# Patient Record
Sex: Female | Born: 1935 | Race: White | Hispanic: No | Marital: Married | Smoking: Never smoker
Health system: Southern US, Community
[De-identification: ages and names within clinical notes are randomized; demographics above are authoritative.]

## PROBLEM LIST (undated history)

## (undated) DIAGNOSIS — D499 Neoplasm of unspecified behavior of unspecified site: Secondary | ICD-10-CM

## (undated) DIAGNOSIS — C801 Malignant (primary) neoplasm, unspecified: Secondary | ICD-10-CM

## (undated) DIAGNOSIS — M1990 Unspecified osteoarthritis, unspecified site: Secondary | ICD-10-CM

## (undated) HISTORY — PX: ABDOMINAL HYSTERECTOMY: SHX81

## (undated) HISTORY — PX: BREAST ENHANCEMENT SURGERY: SHX7

---

## 1998-10-20 ENCOUNTER — Encounter: Payer: Self-pay | Admitting: Geriatric Medicine

## 1998-10-20 ENCOUNTER — Ambulatory Visit (HOSPITAL_COMMUNITY): Admission: RE | Admit: 1998-10-20 | Discharge: 1998-10-20 | Payer: Self-pay | Admitting: Geriatric Medicine

## 1999-12-29 ENCOUNTER — Ambulatory Visit (HOSPITAL_COMMUNITY): Admission: RE | Admit: 1999-12-29 | Discharge: 1999-12-29 | Payer: Self-pay | Admitting: Otolaryngology

## 1999-12-29 ENCOUNTER — Encounter: Payer: Self-pay | Admitting: Otolaryngology

## 2001-01-01 ENCOUNTER — Encounter: Payer: Self-pay | Admitting: Geriatric Medicine

## 2001-01-01 ENCOUNTER — Ambulatory Visit (HOSPITAL_COMMUNITY): Admission: RE | Admit: 2001-01-01 | Discharge: 2001-01-01 | Payer: Self-pay

## 2002-04-04 ENCOUNTER — Ambulatory Visit (HOSPITAL_COMMUNITY): Admission: RE | Admit: 2002-04-04 | Discharge: 2002-04-04 | Payer: Self-pay | Admitting: *Deleted

## 2003-09-02 ENCOUNTER — Ambulatory Visit (HOSPITAL_COMMUNITY): Admission: RE | Admit: 2003-09-02 | Discharge: 2003-09-02 | Payer: Self-pay | Admitting: Geriatric Medicine

## 2003-10-20 ENCOUNTER — Encounter: Admission: RE | Admit: 2003-10-20 | Discharge: 2003-10-20 | Payer: Self-pay | Admitting: Geriatric Medicine

## 2004-04-08 ENCOUNTER — Ambulatory Visit (HOSPITAL_COMMUNITY): Admission: RE | Admit: 2004-04-08 | Discharge: 2004-04-08 | Payer: Self-pay | Admitting: Gastroenterology

## 2004-04-08 ENCOUNTER — Encounter (INDEPENDENT_AMBULATORY_CARE_PROVIDER_SITE_OTHER): Payer: Self-pay | Admitting: Specialist

## 2005-09-04 ENCOUNTER — Ambulatory Visit (HOSPITAL_COMMUNITY): Admission: RE | Admit: 2005-09-04 | Discharge: 2005-09-04 | Payer: Self-pay | Admitting: Internal Medicine

## 2006-09-24 ENCOUNTER — Ambulatory Visit (HOSPITAL_COMMUNITY): Admission: RE | Admit: 2006-09-24 | Discharge: 2006-09-24 | Payer: Self-pay | Admitting: Internal Medicine

## 2007-09-13 ENCOUNTER — Ambulatory Visit (HOSPITAL_COMMUNITY): Admission: RE | Admit: 2007-09-13 | Discharge: 2007-09-13 | Payer: Self-pay | Admitting: Internal Medicine

## 2008-11-18 ENCOUNTER — Ambulatory Visit (HOSPITAL_COMMUNITY): Admission: RE | Admit: 2008-11-18 | Discharge: 2008-11-18 | Payer: Self-pay | Admitting: Internal Medicine

## 2009-12-28 ENCOUNTER — Ambulatory Visit (HOSPITAL_COMMUNITY): Admission: RE | Admit: 2009-12-28 | Discharge: 2009-12-28 | Payer: Self-pay | Admitting: Internal Medicine

## 2011-05-03 ENCOUNTER — Other Ambulatory Visit (HOSPITAL_COMMUNITY): Payer: Self-pay | Admitting: Internal Medicine

## 2011-05-03 DIAGNOSIS — Z1231 Encounter for screening mammogram for malignant neoplasm of breast: Secondary | ICD-10-CM

## 2011-05-16 ENCOUNTER — Ambulatory Visit (HOSPITAL_COMMUNITY): Payer: Medicare Other

## 2011-05-18 ENCOUNTER — Ambulatory Visit (HOSPITAL_COMMUNITY)
Admission: RE | Admit: 2011-05-18 | Discharge: 2011-05-18 | Disposition: A | Discharge status: Home / Self Care | Payer: Medicare Other | Source: Ambulatory Visit | Attending: Internal Medicine | Admitting: Internal Medicine

## 2011-05-18 DIAGNOSIS — Z1231 Encounter for screening mammogram for malignant neoplasm of breast: Secondary | ICD-10-CM | POA: Insufficient documentation

## 2012-10-01 ENCOUNTER — Other Ambulatory Visit (HOSPITAL_COMMUNITY): Payer: Self-pay | Admitting: Internal Medicine

## 2012-10-01 DIAGNOSIS — Z1231 Encounter for screening mammogram for malignant neoplasm of breast: Secondary | ICD-10-CM

## 2012-10-16 HISTORY — PX: OTHER SURGICAL HISTORY: SHX169

## 2012-10-22 ENCOUNTER — Ambulatory Visit (HOSPITAL_COMMUNITY): Payer: Medicare Other

## 2012-10-31 ENCOUNTER — Ambulatory Visit (HOSPITAL_COMMUNITY)
Admission: RE | Admit: 2012-10-31 | Discharge: 2012-10-31 | Disposition: A | Discharge status: Home / Self Care | Payer: Medicare Other | Source: Ambulatory Visit | Attending: Internal Medicine | Admitting: Internal Medicine

## 2012-10-31 DIAGNOSIS — Z1231 Encounter for screening mammogram for malignant neoplasm of breast: Secondary | ICD-10-CM | POA: Insufficient documentation

## 2013-04-23 ENCOUNTER — Other Ambulatory Visit: Payer: Self-pay | Admitting: Dermatology

## 2013-05-07 ENCOUNTER — Other Ambulatory Visit: Payer: Self-pay | Admitting: Dermatology

## 2013-12-08 ENCOUNTER — Other Ambulatory Visit: Payer: Self-pay | Admitting: Gastroenterology

## 2014-01-06 ENCOUNTER — Encounter (HOSPITAL_COMMUNITY): Payer: Self-pay | Admitting: Pharmacy Technician

## 2014-01-07 ENCOUNTER — Encounter (HOSPITAL_COMMUNITY): Payer: Self-pay | Admitting: *Deleted

## 2014-01-27 ENCOUNTER — Encounter (HOSPITAL_COMMUNITY)
Admission: RE | Disposition: A | Discharge status: Home / Self Care | Payer: Self-pay | Source: Ambulatory Visit | Attending: Gastroenterology

## 2014-01-27 ENCOUNTER — Encounter (HOSPITAL_COMMUNITY): Payer: Self-pay | Admitting: *Deleted

## 2014-01-27 ENCOUNTER — Ambulatory Visit (HOSPITAL_COMMUNITY): Payer: Medicare Other | Admitting: Registered Nurse

## 2014-01-27 ENCOUNTER — Encounter (HOSPITAL_COMMUNITY): Payer: Medicare Other | Admitting: Registered Nurse

## 2014-01-27 ENCOUNTER — Ambulatory Visit (HOSPITAL_COMMUNITY)
Admission: RE | Admit: 2014-01-27 | Discharge: 2014-01-27 | Disposition: A | Discharge status: Home / Self Care | Payer: Medicare Other | Source: Ambulatory Visit | Attending: Gastroenterology | Admitting: Gastroenterology

## 2014-01-27 DIAGNOSIS — Z1211 Encounter for screening for malignant neoplasm of colon: Secondary | ICD-10-CM | POA: Insufficient documentation

## 2014-01-27 DIAGNOSIS — I10 Essential (primary) hypertension: Secondary | ICD-10-CM | POA: Insufficient documentation

## 2014-01-27 DIAGNOSIS — K620 Anal polyp: Secondary | ICD-10-CM | POA: Insufficient documentation

## 2014-01-27 DIAGNOSIS — M810 Age-related osteoporosis without current pathological fracture: Secondary | ICD-10-CM | POA: Insufficient documentation

## 2014-01-27 DIAGNOSIS — Z85820 Personal history of malignant melanoma of skin: Secondary | ICD-10-CM | POA: Insufficient documentation

## 2014-01-27 DIAGNOSIS — K621 Rectal polyp: Secondary | ICD-10-CM

## 2014-01-27 DIAGNOSIS — K5730 Diverticulosis of large intestine without perforation or abscess without bleeding: Secondary | ICD-10-CM | POA: Insufficient documentation

## 2014-01-27 HISTORY — PX: COLONOSCOPY WITH PROPOFOL: SHX5780

## 2014-01-27 SURGERY — COLONOSCOPY WITH PROPOFOL
Anesthesia: Monitor Anesthesia Care

## 2014-01-27 MED ORDER — PROPOFOL 10 MG/ML IV BOLUS
INTRAVENOUS | Status: AC
  Filled 2014-01-27: qty 20

## 2014-01-27 MED ORDER — LACTATED RINGERS IV SOLN
INTRAVENOUS | Status: DC | PRN
  Administered 2014-01-27: 07:00:00 via INTRAVENOUS

## 2014-01-27 MED ORDER — LACTATED RINGERS IV SOLN
INTRAVENOUS | Status: DC
  Administered 2014-01-27: 1000 mL via INTRAVENOUS

## 2014-01-27 MED ORDER — SODIUM CHLORIDE 0.9 % IV SOLN: INTRAVENOUS | Status: DC

## 2014-01-27 MED ORDER — PROPOFOL INFUSION 10 MG/ML OPTIME
INTRAVENOUS | Status: DC | PRN
  Administered 2014-01-27: 300 ug/kg/min via INTRAVENOUS

## 2014-01-27 MED ORDER — PROMETHAZINE HCL 25 MG/ML IJ SOLN
6.2500 mg | INTRAMUSCULAR | Status: DC | PRN
Start: 2014-01-27 — End: 2014-01-27

## 2014-01-27 SURGICAL SUPPLY — 22 items

## 2014-01-27 NOTE — Progress Notes (Signed)
Prior to scope insertion time out was preformed at 0802.

## 2014-01-27 NOTE — Anesthesia Preprocedure Evaluation (Signed)
Anesthesia Evaluation  Patient identified by MRN, date of birth, ID band Patient awake    Reviewed: Allergy & Precautions, H&P , NPO status , Patient's Chart, lab work & pertinent test results  Airway Mallampati: II TM Distance: >3 FB Neck ROM: Full    Dental no notable dental hx.    Pulmonary neg pulmonary ROS,  breath sounds clear to auscultation  Pulmonary exam normal       Cardiovascular negative cardio ROS  Rhythm:Regular Rate:Normal     Neuro/Psych negative neurological ROS  negative psych ROS   GI/Hepatic negative GI ROS, Neg liver ROS,   Endo/Other  negative endocrine ROS  Renal/GU negative Renal ROS  negative genitourinary   Musculoskeletal negative musculoskeletal ROS (+)   Abdominal   Peds negative pediatric ROS (+)  Hematology negative hematology ROS (+)   Anesthesia Other Findings   Reproductive/Obstetrics negative OB ROS                           Anesthesia Physical Anesthesia Plan  ASA: I  Anesthesia Plan: MAC   Post-op Pain Management:    Induction: Intravenous  Airway Management Planned: Nasal Cannula  Additional Equipment:   Intra-op Plan:   Post-operative Plan:   Informed Consent: I have reviewed the patients History and Physical, chart, labs and discussed the procedure including the risks, benefits and alternatives for the proposed anesthesia with the patient or authorized representative who has indicated his/her understanding and acceptance.     Plan Discussed with: CRNA and Surgeon  Anesthesia Plan Comments:         Anesthesia Quick Evaluation  

## 2014-01-27 NOTE — Op Note (Signed)
Procedure: Screening colonoscopy  Endoscopist: Earle Gell  Premedication: Propofol administered by anesthesia  Procedure: The patient was placed in the left lateral decubitus position. Anal inspection and digital rectal exam were normal. The Pentax pediatric colonoscope was introduced into the rectum and advanced to the cecum. A normal-appearing appendiceal orifice and ileocecal valve were identified. Colonic preparation for the exam today was good.  Rectum. From the proximal rectum a 3 mm hyperplastic-appearing polyp was removed with the cold biopsy forceps. Retroflex view of the distal rectum was normal.  Sigmoid colon and descending colon. Left colonic diverticulosis  Splenic flexure. Normal  Transverse colon area normal  Hepatic flexure. Normal  Ascending colon. Normal  Cecum and ileocecal valve. Normal  Assessment:  #1. Left colonic diverticulosis  #2. From the proximal rectum a 3 mm hyperplastic-appearing sessile polyp was removed with the cold biopsy forceps   #3. Otherwise normal screening proctocolonoscopy to the cecum

## 2014-01-27 NOTE — Transfer of Care (Signed)
Immediate Anesthesia Transfer of Care Note  Patient: Colleen Hughes  Procedure(s) Performed: Procedure(s): COLONOSCOPY WITH PROPOFOL (N/A)  Patient Location: PACU  Anesthesia Type:General  Level of Consciousness: awake, alert , oriented and patient cooperative  Airway & Oxygen Therapy: Patient Spontanous Breathing and Patient connected to face mask oxygen  Post-op Assessment: Report given to PACU RN, Post -op Vital signs reviewed and stable and Patient moving all extremities X 4  Post vital signs: stable  Complications: No apparent anesthesia complications

## 2014-01-27 NOTE — Anesthesia Postprocedure Evaluation (Signed)
  Anesthesia Post-op Note  Patient: Colleen Hughes  Procedure(s) Performed: Procedure(s) (LRB): COLONOSCOPY WITH PROPOFOL (N/A)  Patient Location: PACU  Anesthesia Type: MAC  Level of Consciousness: awake and alert   Airway and Oxygen Therapy: Patient Spontanous Breathing  Post-op Pain: mild  Post-op Assessment: Post-op Vital signs reviewed, Patient's Cardiovascular Status Stable, Respiratory Function Stable, Patent Airway and No signs of Nausea or vomiting  Last Vitals:  Filed Vitals:   01/27/14 0845  BP: 104/56  Pulse:   Temp:   Resp: 18    Post-op Vital Signs: stable   Complications: No apparent anesthesia complications

## 2014-01-27 NOTE — H&P (Signed)
  Procedure: Screening colonoscopy. Normal baseline screening colonoscopy performed 04/08/2004.  History: The patient is a 78 year old female born 10/12/1936. She is scheduled to undergo a repeat screening colonoscopy with polypectomy to prevent colon cancer.  Past medical history: Melanoma surgery of the left neck. Total abdominal hysterectomy for fibroid tumors. Bilateral breast implants. Hypertension. Osteoporosis complicated by thoracic compression fracture. Right sciatica. Left wrist cyst. Left ankle fracture.  Medication allergies: Codeine causes nausea. Darvocet causes nausea.  Exam: The patient is alert and lying comfortably on the endoscopy stretcher. Abdomen is soft and nontender to palpation. Lungs are clear to auscultation. Cardiac exam reveals a regular rhythm.  Plan: Proceed with screening colonoscopy

## 2014-01-27 NOTE — Discharge Instructions (Signed)
Colonoscopy °Care After °These instructions give you information on caring for yourself after your procedure. Your doctor may also give you more specific instructions. Call your doctor if you have any problems or questions after your procedure. °HOME CARE °· Take it easy for the next 24 hours. °· Rest. °· Walk or use warm packs on your belly (abdomen) if you have belly cramping or gas. °· Do not drive for 24 hours. °· You may shower. °· Do not sign important papers or use machinery for 24 hours. °· Drink enough fluids to keep your pee (urine) clear or pale yellow. °· Resume your normal diet. Avoid heavy or fried foods. °· Avoid alcohol. °· Continue taking your normal medicines. °· Only take medicine as told by your doctor. Do not take aspirin. °If you had growths (polyps) removed: °· Do not take aspirin. °· Do not drink alcohol for 7 days or as told by your doctor. °· Eat a soft diet for 24 hours. °GET HELP RIGHT AWAY IF: °· You have a fever. °· You pass clumps of tissue (blood clots) or fill the toilet with blood. °· You have belly pain that gets worse and medicine does not help. °· Your belly is puffy (swollen). °· You feel sick to your stomach (nauseous) or throw up (vomit). °MAKE SURE YOU: °· Understand these instructions. °· Will watch your condition. °· Will get help right away if you are not doing well or get worse. °Document Released: 11/04/2010 Document Revised: 12/25/2011 Document Reviewed: 06/09/2013 °[employer]® Patient Information ©2014 [employer]. °Monitored Anesthesia Care  °Monitored anesthesia care is an anesthesia service for a medical procedure. Anesthesia is the loss of the ability to feel pain. It is produced by medications called anesthetics. It may affect a small area of your body (local anesthesia), a large area of your body (regional anesthesia), or your entire body (general anesthesia). The need for monitored anesthesia care depends your procedure, your condition, and the potential need  for regional or general anesthesia. It is often provided during procedures where:  °· General anesthesia may be needed if there are complications. This is because you need special care when you are under general anesthesia.   °· You will be under local or regional anesthesia. This is so that you are able to have higher levels of anesthesia if needed.   °· You will receive calming medications (sedatives). This is especially the case if sedatives are given to put you in a semi-conscious state of relaxation (deep sedation). This is because the amount of sedative needed to produce this state can be hard to predict. Too much of a sedative can produce general anesthesia. °Monitored anesthesia care is performed by one or more caregivers who have special training in all types of anesthesia. You will need to meet with these caregivers before your procedure. During this meeting, they will ask you about your medical history. They will also give you instructions to follow. (For example, you will need to stop eating and drinking before your procedure. You may also need to stop or change medications you are taking.) During your procedure, your caregivers will stay with you. They will:  °· Watch your condition. This includes watching you blood pressure, breathing, and level of pain.   °· Diagnose and treat problems that occur.   °· Give medications if they are needed. These may include calming medications (sedatives) and anesthetics.   °· Make sure you are comfortable.   °Having monitored anesthesia care does not necessarily mean that you will be under anesthesia.   It does mean that your caregivers will be able to manage anesthesia if you need it or if it occurs. It also means that you will be able to have a different type of anesthesia than you are having if you need it. When your procedure is complete, your caregivers will continue to watch your condition. They will make sure any medications wear off before you are allowed to go  home.  °Document Released: 06/28/2005 Document Revised: 01/27/2013 Document Reviewed: 11/13/2012 °[employer]® Patient Information ©2014 [employer]. ° °

## 2014-01-28 ENCOUNTER — Encounter (HOSPITAL_COMMUNITY): Payer: Self-pay | Admitting: Gastroenterology

## 2015-02-18 ENCOUNTER — Other Ambulatory Visit (HOSPITAL_COMMUNITY): Payer: Self-pay | Admitting: Dermatology

## 2018-05-08 ENCOUNTER — Encounter (HOSPITAL_COMMUNITY): Payer: Self-pay

## 2018-05-08 ENCOUNTER — Other Ambulatory Visit: Payer: Self-pay | Admitting: Internal Medicine

## 2018-05-08 ENCOUNTER — Other Ambulatory Visit: Payer: Self-pay

## 2018-05-08 ENCOUNTER — Emergency Department (HOSPITAL_COMMUNITY)
Admission: EM | Admit: 2018-05-08 | Discharge: 2018-05-08 | Disposition: A | Discharge status: Home / Self Care | Payer: Medicare Other | Attending: Emergency Medicine | Admitting: Emergency Medicine

## 2018-05-08 DIAGNOSIS — R319 Hematuria, unspecified: Secondary | ICD-10-CM

## 2018-05-08 DIAGNOSIS — Z79899 Other long term (current) drug therapy: Secondary | ICD-10-CM | POA: Insufficient documentation

## 2018-05-08 DIAGNOSIS — N3001 Acute cystitis with hematuria: Secondary | ICD-10-CM | POA: Insufficient documentation

## 2018-05-08 DIAGNOSIS — Z7982 Long term (current) use of aspirin: Secondary | ICD-10-CM | POA: Diagnosis not present

## 2018-05-08 DIAGNOSIS — N390 Urinary tract infection, site not specified: Secondary | ICD-10-CM

## 2018-05-08 DIAGNOSIS — R300 Dysuria: Secondary | ICD-10-CM | POA: Diagnosis present

## 2018-05-08 LAB — CBC WITH DIFFERENTIAL/PLATELET
Abs Immature Granulocytes: 0.1 10*3/uL (ref 0.0–0.1)
BASOS ABS: 0.1 10*3/uL (ref 0.0–0.1)
Basophils Relative: 0 %
EOS PCT: 3 %
Eosinophils Absolute: 0.5 10*3/uL (ref 0.0–0.7)
HEMATOCRIT: 33.8 % — AB (ref 36.0–46.0)
HEMOGLOBIN: 10.1 g/dL — AB (ref 12.0–15.0)
Immature Granulocytes: 1 %
Lymphocytes Relative: 16 %
Lymphs Abs: 3 10*3/uL (ref 0.7–4.0)
MCH: 26.9 pg (ref 26.0–34.0)
MCHC: 29.9 g/dL — AB (ref 30.0–36.0)
MCV: 90.1 fL (ref 78.0–100.0)
Monocytes Absolute: 1.3 10*3/uL — ABNORMAL HIGH (ref 0.1–1.0)
Monocytes Relative: 7 %
Neutro Abs: 14.2 10*3/uL — ABNORMAL HIGH (ref 1.7–7.7)
Neutrophils Relative %: 73 %
Platelets: 381 10*3/uL (ref 150–400)
RBC: 3.75 MIL/uL — ABNORMAL LOW (ref 3.87–5.11)
RDW: 14.6 % (ref 11.5–15.5)
WBC: 19.2 10*3/uL — AB (ref 4.0–10.5)

## 2018-05-08 LAB — URINALYSIS, ROUTINE W REFLEX MICROSCOPIC
Bilirubin Urine: NEGATIVE
Glucose, UA: NEGATIVE mg/dL
KETONES UR: NEGATIVE mg/dL
Nitrite: NEGATIVE
PH: 6 (ref 5.0–8.0)
Protein, ur: 100 mg/dL — AB
RBC / HPF: >50 RBC/hpf — ABNORMAL HIGH (ref 0–5)
Specific Gravity, Urine: 1.015 (ref 1.005–1.030)

## 2018-05-08 LAB — COMPREHENSIVE METABOLIC PANEL
ALBUMIN: 3.3 g/dL — AB (ref 3.5–5.0)
ALT: 11 U/L (ref 0–44)
AST: 16 U/L (ref 15–41)
Alkaline Phosphatase: 52 U/L (ref 38–126)
Anion gap: 10 (ref 5–15)
BILIRUBIN TOTAL: 0.6 mg/dL (ref 0.3–1.2)
BUN: 25 mg/dL — ABNORMAL HIGH (ref 8–23)
CO2: 25 mmol/L (ref 22–32)
Calcium: 8.7 mg/dL — ABNORMAL LOW (ref 8.9–10.3)
Chloride: 104 mmol/L (ref 98–111)
Creatinine, Ser: 1.7 mg/dL — ABNORMAL HIGH (ref 0.44–1.00)
GFR calc Af Amer: 32 mL/min — ABNORMAL LOW (ref >60)
GFR calc non Af Amer: 27 mL/min — ABNORMAL LOW (ref >60)
Glucose, Bld: 96 mg/dL (ref 70–99)
POTASSIUM: 4.2 mmol/L (ref 3.5–5.1)
Sodium: 139 mmol/L (ref 135–145)
TOTAL PROTEIN: 6.6 g/dL (ref 6.5–8.1)

## 2018-05-08 LAB — I-STAT CG4 LACTIC ACID, ED: Lactic Acid, Venous: 0.81 mmol/L (ref 0.5–1.9)

## 2018-05-08 MED ORDER — SODIUM CHLORIDE 0.9 % IV SOLN
1.0000 g | Freq: Once | INTRAVENOUS | Status: AC
  Administered 2018-05-08: 1 g via INTRAVENOUS
  Filled 2018-05-08: qty 10

## 2018-05-08 MED ORDER — CEPHALEXIN 500 MG PO CAPS: 500.0000 mg | ORAL_CAPSULE | Freq: Four times a day (QID) | ORAL | 0 refills | Status: AC

## 2018-05-08 NOTE — ED Notes (Signed)
Pt given sandwich bag and water per Dr. Tomi Bamberger

## 2018-05-08 NOTE — ED Provider Notes (Signed)
[facility] Provider Note   CSN: [id number] Arrival date & time: 05/08/18  1914     History   Chief Complaint Chief Complaint  Patient presents with  . Urinary Tract Infection    HPI Colleen Hughes is a 82 y.o. female.  HPI   Patient is an 82 year old female with a history of abdominal hysterectomy, who presents to the emergency department today for evaluation of dysuria, hematuria, and urinary frequency that have been ongoing for the last month but have seemed to worsen over the last 2 weeks.  Patient states that she had tried to treat her symptoms at home with Azo for the last few weeks, however she noted yesterday that she was having more hematuria than she has previously and she passed several clots throughout the day.  She also notes that she has had chills and has taken her temperature at home and her temperature has not been above 99.30F.  She has had intermittent lower abdominal pain for the last several months.  She denies any currently.  Denies any nausea, vomiting, diarrhea or constipation.  Denies any chest pain or shortness of breath. No vaginal bleeding or bloody stools. She was seen by her PCP who did a UA and lab work.  She states that she was contacted by her PCP and informed that she had a high white blood cell count and she needed to be seen in the emergency department.  States that her PCP scheduled her for an outpatient abdominal ultrasound on 05/17/2018.  History reviewed. No pertinent past medical history.  There are no active problems to display for this patient.   Past Surgical History:  Procedure Laterality Date  . ABDOMINAL HYSTERECTOMY     partial  . BREAST ENHANCEMENT SURGERY Left mid 1980,s  . COLONOSCOPY WITH PROPOFOL N/A 01/27/2014   Procedure: COLONOSCOPY WITH PROPOFOL;  Surgeon: Garlan Fair, MD;  Location: [facility];  Service: Endoscopy;  Laterality: N/A;     OB History   None      Home Medications     Prior to Admission medications   Medication Sig Start Date End Date Taking? Authorizing Provider  alendronate (FOSAMAX) 70 MG tablet Take 70 mg by mouth once a week. Take with a full glass of water on an empty stomach.    [provider]  aspirin EC 81 MG tablet Take 81 mg by mouth daily.    [provider]  calcium citrate-vitamin D (CITRACAL+D) 315-200 MG-UNIT per tablet Take 2 tablets by mouth daily.    [provider]  cephALEXin (KEFLEX) 500 MG capsule Take 1 capsule (500 mg total) by mouth 4 (four) times daily for 7 days. 05/08/18 05/15/18  Barbra Miner S, PA-C  Cholecalciferol (VITAMIN D) 2000 UNITS tablet Take 2,000 Units by mouth every other day.    [provider]  hydrochlorothiazide (HYDRODIURIL) 25 MG tablet Take 25 mg by mouth every morning.    [provider]  Multiple Vitamin (MULTIVITAMIN WITH MINERALS) TABS tablet Take 1 tablet by mouth daily.    [provider]    Family History No family history on file.  Social History Social History   Tobacco Use  . Smoking status: Never Smoker  . Smokeless tobacco: Never Used  Substance Use Topics  . Alcohol use: Yes    Comment: rarely  . Drug use: No     Allergies   Patient has no known allergies.   Review of Systems Review of  Systems  Constitutional: Positive for chills. Negative for fever.  HENT: Negative for sore throat.   Eyes: Negative for visual disturbance.  Respiratory: Negative for shortness of breath.   Cardiovascular: Negative for chest pain.  Gastrointestinal: Negative for abdominal pain, blood in stool, constipation, diarrhea, nausea and vomiting.  Genitourinary: Positive for dysuria, frequency, hematuria, pelvic pain and urgency. Negative for vaginal discharge.  Musculoskeletal: Negative for back pain.  Skin: Negative for wound.  Neurological: Negative for headaches.   Physical Exam Updated Vital Signs BP (!) 79/55   Pulse 92   Temp 98.6 F  (37 C) (Oral)   Resp 20   SpO2 100%   Physical Exam  Constitutional: She appears well-developed and well-nourished. No distress.  Pt is well appearing. Nontoxic.   HENT:  Head: Normocephalic and atraumatic.  Mouth/Throat: Oropharynx is clear and moist.  Eyes: Conjunctivae are normal. No scleral icterus.  Neck: Neck supple.  Cardiovascular: Normal rate, regular rhythm, normal heart sounds and intact distal pulses.  No murmur heard. Pulmonary/Chest: Effort normal and breath sounds normal. No stridor. No respiratory distress. She has no wheezes.  Abdominal: Soft. Bowel sounds are normal. She exhibits no distension. There is no tenderness. There is no guarding.  No CVA tenderness.  No significant suprapubic tenderness.  Musculoskeletal: She exhibits no edema.  Extremities warm and well perfused  Neurological: She is alert.  Skin: Skin is warm and dry. Capillary refill takes less than 2 seconds.  Psychiatric: She has a normal mood and affect.  Nursing note and vitals reviewed.  ED Treatments / Results  Labs (all labs ordered are listed, but only abnormal results are displayed) Labs Reviewed  COMPREHENSIVE METABOLIC PANEL - Abnormal; Notable for the following components:      Result Value   BUN 25 (*)    Creatinine, Ser 1.70 (*)    Calcium 8.7 (*)    Albumin 3.3 (*)    GFR calc non Af Amer 27 (*)    GFR calc Af Amer 32 (*)    All other components within normal limits  CBC WITH DIFFERENTIAL/PLATELET - Abnormal; Notable for the following components:   WBC 19.2 (*)    RBC 3.75 (*)    Hemoglobin 10.1 (*)    HCT 33.8 (*)    MCHC 29.9 (*)    Neutro Abs 14.2 (*)    Monocytes Absolute 1.3 (*)    All other components within normal limits  URINALYSIS, ROUTINE W REFLEX MICROSCOPIC - Abnormal; Notable for the following components:   APPearance CLOUDY (*)    Hgb urine dipstick LARGE (*)    Protein, ur 100 (*)    Leukocytes, UA LARGE (*)    RBC / HPF >50 (*)    WBC, UA >50 (*)     Bacteria, UA RARE (*)    Non Squamous Epithelial 0-5 (*)    All other components within normal limits  URINE CULTURE  I-STAT CG4 LACTIC ACID, ED  I-STAT CG4 LACTIC ACID, ED    EKG None  Radiology No results found.  Procedures Procedures (including critical care time)  Medications Ordered in ED Medications  cefTRIAXone (ROCEPHIN) 1 g in sodium chloride 0.9 % 100 mL IVPB (has no administration in time range)     Initial Impression / Assessment and Plan / ED Course  I have reviewed the triage vital signs and the nursing notes.  Pertinent labs & imaging results that were available during my care of the patient were reviewed by me and  considered in my medical decision making (see chart for details).  Discussed pt presentation and exam findings with Dr. Tomi Bamberger, who evaluated the patient and agrees with the plan to discharge the patient on p.o. antibiotics.  Advised to give a dose of antibiotics in the ED.  Bladder scan completed and patient had 342 cc in the bladder after voiding.  Final Clinical Impressions(s) / ED Diagnoses   Final diagnoses:  Urinary tract infection with hematuria, site unspecified   Pt has been diagnosed with a UTI. Pt is afebrile, no CVA tenderness, normotensive, and denies N/V.  Patient does have a leukocytosis to 19.1.  Mild anemia.  Her creatinine is elevated 1.7 her BUN is 25.  Lactic acid was negative.  UA consistent with UTI.  Also has large hematuria, proteinuria, greater than 50 RBCs, greater than 50 WBCs.  No prior labs to compare but patient denies history of CKD.  Advised her to orally hydrate and have her labs rechecked by her primary doctor later this week.  She is very well-appearing and is nontoxic.  Do not suspect sepsis in this patient.  Pt to be dc home with antibiotics and instructions to follow up with PCP if symptoms persist.  We will also give her a referral to urologist to follow-up with with regard to her hematuria.  Advised patient to  return to the ER for any new or worsening symptoms.  The patient and her son at bedside voiced an understanding of the plan and reasons to return immediately to the ED.  All questions were answered.  ED Discharge Orders        Ordered    cephALEXin (KEFLEX) 500 MG capsule  4 times daily     05/08/18 2228       Rodney Booze, PA-C 05/08/18 2235    Dorie Rank, MD 05/09/18 [phone]

## 2018-05-08 NOTE — Discharge Instructions (Signed)
You were given a prescription for antibiotics. Please take the antibiotic prescription fully.   Please follow up with urology and make an appointment for re-evaluation.   Please follow up with your primary doctor next week for re-evaluation.  Please return to the emergency room for any new or worsening symptoms including any fevers, abdominal pain, inability to urinate.

## 2018-05-08 NOTE — ED Triage Notes (Signed)
Pt states that she went to see her PCP for UTI, dysuria and hematuria, fevers, and placed on antibiotic, told to come here due to high WBC

## 2018-05-10 LAB — URINE CULTURE

## 2018-05-17 ENCOUNTER — Other Ambulatory Visit: Payer: Medicare Other

## 2018-05-31 ENCOUNTER — Other Ambulatory Visit: Payer: Self-pay

## 2018-05-31 ENCOUNTER — Other Ambulatory Visit: Payer: Self-pay | Admitting: Urology

## 2018-05-31 ENCOUNTER — Encounter (HOSPITAL_COMMUNITY): Payer: Self-pay | Admitting: *Deleted

## 2018-05-31 NOTE — Progress Notes (Signed)
Left voice mail message with [facility] Cord permit for surgery says transureethrall resection of bladder tumor and case is schedule for transurethral resection of bladder tumor and right cystoscopy

## 2018-05-31 NOTE — Progress Notes (Signed)
Spoke with dr Johnette Abraham. Gunnar Bulla and reviewed patient medical history and patient takes hctz, ekg orders received from dr e fitzgerald for day of surgery and placed in epic.

## 2018-06-02 NOTE — H&P (Signed)
}   Office Visit Report 05/30/2018    Colleen Hughes         MRN: [id number]  PRIMARY CARE:  Colleen Ahmadi, MD  DOB: 1936-07-30, 82 year old Female  REFERRING:    SSN: [id number]  PROVIDER:  Baruch Gouty, M.D.    TREATING:  Colleen Hughes, M.D.    LOCATION:  [facility] [phone]    CC: I have bladder cancer that needs to be treated.  HPI: Colleen Hughes is a 82 year-old female established patient who is here for follow-up of bladder cancer treatment.  Patient referred by Dr. Pilar Jarvis. Patient presented with gross hematuria and clots. She underwent CT scan May 17, 2018 which showed a right bladder mass, 6 cm, right hydroureteronephrosis. It did not appear to be T3 disease. The bones and the lymph nodes appeared normal.   She returns and has hesitancy and weak stream. Prior PVR was elevated.     ALLERGIES: Codeine - Nausea Darvocet - Nausea    MEDICATIONS: Aspirin 81 mg tablet,chewable  Hydrochlorothiazide 25 mg tablet  Aspirin PO Daily  Calcium Citrate - Vitamin D  CITRACAL + D PO Daily  HYDROCHLOROTHIAZIDE Daily  MULTIPLE VITAMINS PO Daily  Multivitamin  Vitamin D2 2,000 unit tablet  VITAMIN D2 Daily     GU PSH: Hysterectomy Locm 300-399Mg /Ml Iodine,1Ml - 05/17/2018    NON-GU PSH: Colonoscopy Partial Hysterectomy        GU PMH: Acute Cystitis/UTI - 05/13/2018 Gross hematuria - 05/13/2018 Incomplete bladder emptying - 05/13/2018    NON-GU PMH: None    FAMILY HISTORY: Prostate Cancer - Father    SOCIAL HISTORY: Marital Status: Widowed Preferred Language: English; Ethnicity: Not Hispanic Or Latino; Race: White Current Smoking Status: Patient has never smoked.  <DIV'  Tobacco Use Assessment Completed:  Used Tobacco in last 30 days?   Has never drank.  Does not use drugs. Drinks 1 caffeinated drink per day. Has not had a blood transfusion. Patient's occupation Estate agent.     REVIEW OF SYSTEMS:     GU Review Female:  Patient  reports frequent urination, get up at night to urinate, trouble starting your stream, and have to strain to urinate. Patient denies hard to postpone urination, burning /pain with urination, leakage of urine, stream starts and stops, and being pregnant.    Gastrointestinal (Upper):  Patient denies nausea, vomiting, and indigestion/ heartburn.    Gastrointestinal (Lower):  Patient denies diarrhea and constipation.    Constitutional:  Patient denies fever, night sweats, weight loss, and fatigue.    Skin:  Patient denies skin rash/ lesion and itching.    Eyes:  Patient denies blurred vision and double vision.    Ears/ Nose/ Throat:  Patient denies sore throat and sinus problems.    Hematologic/Lymphatic:  Patient denies swollen glands and easy bruising.    Cardiovascular:  Patient denies leg swelling and chest pains.    Respiratory:  Patient denies cough and shortness of breath.    Endocrine:  Patient denies excessive thirst.    Musculoskeletal:  Patient denies back pain and joint pain.    Neurological:  Patient denies headaches and dizziness.    Psychologic:  Patient denies depression and anxiety.    VITAL SIGNS:       05/30/2018 08:21 AM     BP 146/72 mmHg     Pulse 102 /min     Temperature 97.1 F / 36.1 C     GU PHYSICAL  EXAMINATION:      Urethra: No tenderness, no mass, no scarring. No hypermobility. No leakage.     Bladder: Bladder mass present at right BN/anterior vaginal wall - large and hard mass. Normal to palpation, no tenderness, normal size.      Vagina: Moderate introital stenosis. No atrophy. No rectocele. No cystocele. No enterocele.      MULTI-SYSTEM PHYSICAL EXAMINATION:      Constitutional: Well-nourished. No physical deformities. Normally developed. Good grooming.     Neck: Neck symmetrical, not swollen. Normal tracheal position.     Respiratory: No labored breathing, no use of accessory muscles.      Cardiovascular: Normal temperature, normal extremity pulses, no swelling,  no varicosities.     Neurologic / Psychiatric: Oriented to time, oriented to place, oriented to person. No depression, no anxiety, no agitation.     Gastrointestinal: No mass, no tenderness, no rigidity, non obese abdomen.            PAST DATA REVIEWED:   Source Of History:  Patient  Records Review:  Previous Doctor Records  X-Ray Review: C.T. Abdomen/Pelvis: Reviewed Films. 2019    Notes:  dr Pilar Jarvis notes   PROCEDURES:    Flexible Cystoscopy - 52000  Risks, benefits, and some of the potential complications of the procedure were discussed at length with the patient including infection, bleeding, voiding discomfort, urinary retention, fever, chills, sepsis, and others. All questions were answered. Informed consent was obtained. Antibiotic prophylaxis was given. Sterile technique and intraurethral analgesia were used. Chaperone - Lauren - for exam and cystoscopy. She was cathed for a 300 ml residual.   Meatus:  Normal size. Normal location. Normal condition.  Urethra:  No hypermobility. No leakage.  Ureteral Orifices:  Normal left location effluxed clear urine. Right UO obliterated by mass.   Bladder:  A large right necrotic lateral wall tumor encroaching on bladder neck. No trabeculation. No stones.      The lower urinary tract was carefully examined. The procedure was well-tolerated and without complications. Antibiotic instructions were given. Instructions were given to call the office immediately for bloody urine, difficulty urinating, urinary retention, painful or frequent urination, fever, chills, nausea, vomiting or other illness. The patient stated that she understood these instructions and would comply with them.    Urinalysis w/Scope  Dipstick Dipstick Cont'd Micro  Color: Yellow Bilirubin: Neg mg/dL WBC/hpf: 40 - 60/hpf  Appearance: Cloudy Ketones: Neg mg/dL RBC/hpf: 10 - 20/hpf  Specific Gravity: 1.020 Blood: 3+ ery/uL Bacteria: Few (10-25/hpf)  pH: 6.0 Protein: 2+ mg/dL  Cystals: NS (Not Seen)  Glucose: Neg mg/dL Urobilinogen: 0.2 mg/dL Casts: NS (Not Seen)   Nitrites: Positive Trichomonas: Not Present   Leukocyte Esterase: 3+ leu/uL Mucous: Not Present    Epithelial Cells: 0 - 5/hpf    Yeast: NS (Not Seen)    Sperm: Not Present   Notes: Microscopic done on unconcentrated urine     ASSESSMENT:     ICD-10 Details  1 GU:  Incomplete bladder emptying - R39.14   2  Bladder tumor/neoplasm - D41.4   3  Hydronephrosis - N13.0    PLAN:   Orders  Labs Urine Culture  Schedule  Return Visit/Planned Activity: Next Available Appointment - Schedule Surgery  Document  Letter(s):  Created for Patient: Clinical Summary   Notes:  Bladder tumor-clinically this tumor is T2 possibly T3/T4 at right BN. I discussed with the patient and her son the nature risks benefits and alternatives to TURBT. We discussed in  the long run we will probably need to consider medical oncology for chemotherapy and then cystectomy.   Right hydronephrosis-we will monitor and try and get a stent in at the procedure.   Incomplete bladder emptying-offered the patient a Foley catheter and she declined.   cc: Dr. Laurann Montana    * Signed by Colleen Hughes, M.D. on 05/30/18 at 3:55 PM (EDT)*  * Signed by Colleen Hughes, M.D. on 05/30/18 at 3:56 PM (EDT)*   The information contained in this medical record document is considered private and confidential patient information. This information can only be used for the medical diagnosis and/or medical services that are being provided by the patient's selected caregivers. This information can only be distributed outside of the patient's care if the patient agrees and signs waivers of authorization for this information to be sent to an outside source or route.

## 2018-06-03 ENCOUNTER — Ambulatory Visit (HOSPITAL_COMMUNITY): Payer: Medicare Other | Admitting: Certified Registered Nurse Anesthetist

## 2018-06-03 ENCOUNTER — Encounter (HOSPITAL_COMMUNITY): Payer: Self-pay | Admitting: *Deleted

## 2018-06-03 ENCOUNTER — Ambulatory Visit (HOSPITAL_COMMUNITY)
Admission: RE | Admit: 2018-06-03 | Discharge: 2018-06-03 | Disposition: A | Discharge status: Home / Self Care | Payer: Medicare Other | Source: Ambulatory Visit | Attending: Urology | Admitting: Urology

## 2018-06-03 ENCOUNTER — Encounter (HOSPITAL_COMMUNITY)
Admission: RE | Disposition: A | Discharge status: Home / Self Care | Payer: Self-pay | Source: Ambulatory Visit | Attending: Urology

## 2018-06-03 DIAGNOSIS — D414 Neoplasm of uncertain behavior of bladder: Secondary | ICD-10-CM

## 2018-06-03 DIAGNOSIS — Z7982 Long term (current) use of aspirin: Secondary | ICD-10-CM | POA: Insufficient documentation

## 2018-06-03 DIAGNOSIS — D649 Anemia, unspecified: Secondary | ICD-10-CM | POA: Insufficient documentation

## 2018-06-03 DIAGNOSIS — Z79899 Other long term (current) drug therapy: Secondary | ICD-10-CM | POA: Insufficient documentation

## 2018-06-03 DIAGNOSIS — N1330 Unspecified hydronephrosis: Secondary | ICD-10-CM | POA: Insufficient documentation

## 2018-06-03 DIAGNOSIS — C678 Malignant neoplasm of overlapping sites of bladder: Secondary | ICD-10-CM | POA: Insufficient documentation

## 2018-06-03 DIAGNOSIS — R3914 Feeling of incomplete bladder emptying: Secondary | ICD-10-CM | POA: Insufficient documentation

## 2018-06-03 HISTORY — DX: Unspecified osteoarthritis, unspecified site: M19.90

## 2018-06-03 HISTORY — PX: TRANSURETHRAL RESECTION OF BLADDER TUMOR: SHX2575

## 2018-06-03 LAB — BASIC METABOLIC PANEL
ANION GAP: 13 (ref 5–15)
BUN: 35 mg/dL — ABNORMAL HIGH (ref 8–23)
CALCIUM: 8.8 mg/dL — AB (ref 8.9–10.3)
CO2: 22 mmol/L (ref 22–32)
Chloride: 105 mmol/L (ref 98–111)
Creatinine, Ser: 1.8 mg/dL — ABNORMAL HIGH (ref 0.44–1.00)
GFR calc non Af Amer: 25 mL/min — ABNORMAL LOW (ref >60)
GFR, EST AFRICAN AMERICAN: 29 mL/min — AB (ref >60)
GLUCOSE: 99 mg/dL (ref 70–99)
POTASSIUM: 4.3 mmol/L (ref 3.5–5.1)
Sodium: 140 mmol/L (ref 135–145)

## 2018-06-03 LAB — CBC
HEMATOCRIT: 33.4 % — AB (ref 36.0–46.0)
HEMOGLOBIN: 10.4 g/dL — AB (ref 12.0–15.0)
MCH: 27.2 pg (ref 26.0–34.0)
MCHC: 31.1 g/dL (ref 30.0–36.0)
MCV: 87.4 fL (ref 78.0–100.0)
Platelets: 460 10*3/uL — ABNORMAL HIGH (ref 150–400)
RBC: 3.82 MIL/uL — AB (ref 3.87–5.11)
RDW: 14.6 % (ref 11.5–15.5)
WBC: 19.6 10*3/uL — ABNORMAL HIGH (ref 4.0–10.5)

## 2018-06-03 SURGERY — TURBT (TRANSURETHRAL RESECTION OF BLADDER TUMOR)
Anesthesia: General | Site: Bladder

## 2018-06-03 MED ORDER — LIDOCAINE HCL (CARDIAC) PF 100 MG/5ML IV SOSY
PREFILLED_SYRINGE | INTRAVENOUS | Status: DC | PRN
  Administered 2018-06-03: 40 mg via INTRAVENOUS

## 2018-06-03 MED ORDER — DEXAMETHASONE SODIUM PHOSPHATE 10 MG/ML IJ SOLN
INTRAMUSCULAR | Status: DC | PRN
  Administered 2018-06-03: 5 mg via INTRAVENOUS

## 2018-06-03 MED ORDER — SODIUM CHLORIDE 0.9 % IR SOLN
Status: DC | PRN
  Administered 2018-06-03: 33000 mL

## 2018-06-03 MED ORDER — DEXAMETHASONE SODIUM PHOSPHATE 10 MG/ML IJ SOLN
INTRAMUSCULAR | Status: AC
  Filled 2018-06-03: qty 1

## 2018-06-03 MED ORDER — FENTANYL CITRATE (PF) 100 MCG/2ML IJ SOLN
INTRAMUSCULAR | Status: AC
  Filled 2018-06-03: qty 2

## 2018-06-03 MED ORDER — LIDOCAINE 2% (20 MG/ML) 5 ML SYRINGE
INTRAMUSCULAR | Status: AC
  Filled 2018-06-03: qty 5

## 2018-06-03 MED ORDER — FENTANYL CITRATE (PF) 100 MCG/2ML IJ SOLN
INTRAMUSCULAR | Status: DC | PRN
  Administered 2018-06-03 (×4): 25 ug via INTRAVENOUS

## 2018-06-03 MED ORDER — FENTANYL CITRATE (PF) 100 MCG/2ML IJ SOLN
25.0000 ug | INTRAMUSCULAR | Status: DC | PRN
  Administered 2018-06-03 (×2): 50 ug via INTRAVENOUS

## 2018-06-03 MED ORDER — MEPERIDINE HCL 50 MG/ML IJ SOLN: 6.2500 mg | INTRAMUSCULAR | Status: DC | PRN

## 2018-06-03 MED ORDER — PHENYLEPHRINE 40 MCG/ML (10ML) SYRINGE FOR IV PUSH (FOR BLOOD PRESSURE SUPPORT)
PREFILLED_SYRINGE | INTRAVENOUS | Status: DC | PRN
  Administered 2018-06-03 (×12): 80 ug via INTRAVENOUS

## 2018-06-03 MED ORDER — PHENYLEPHRINE 40 MCG/ML (10ML) SYRINGE FOR IV PUSH (FOR BLOOD PRESSURE SUPPORT)
PREFILLED_SYRINGE | INTRAVENOUS | Status: AC
  Filled 2018-06-03: qty 10

## 2018-06-03 MED ORDER — CEFAZOLIN SODIUM-DEXTROSE 2-4 GM/100ML-% IV SOLN
2.0000 g | Freq: Once | INTRAVENOUS | Status: AC
  Administered 2018-06-03: 2 g via INTRAVENOUS
  Filled 2018-06-03: qty 100

## 2018-06-03 MED ORDER — ONDANSETRON HCL 4 MG/2ML IJ SOLN: 4.0000 mg | Freq: Once | INTRAMUSCULAR | Status: DC | PRN

## 2018-06-03 MED ORDER — ONDANSETRON HCL 4 MG/2ML IJ SOLN
INTRAMUSCULAR | Status: AC
  Filled 2018-06-03: qty 2

## 2018-06-03 MED ORDER — ONDANSETRON HCL 4 MG/2ML IJ SOLN
INTRAMUSCULAR | Status: DC | PRN
  Administered 2018-06-03: 4 mg via INTRAVENOUS

## 2018-06-03 MED ORDER — PROPOFOL 10 MG/ML IV BOLUS
INTRAVENOUS | Status: AC
  Filled 2018-06-03: qty 20

## 2018-06-03 MED ORDER — 0.9 % SODIUM CHLORIDE (POUR BTL) OPTIME
TOPICAL | Status: DC | PRN
  Administered 2018-06-03: 1000 mL

## 2018-06-03 MED ORDER — LACTATED RINGERS IV SOLN
INTRAVENOUS | Status: DC
  Administered 2018-06-03 (×2): via INTRAVENOUS

## 2018-06-03 MED ORDER — STERILE WATER FOR IRRIGATION IR SOLN
Status: DC | PRN
  Administered 2018-06-03: 500 mL

## 2018-06-03 MED ORDER — PROPOFOL 10 MG/ML IV BOLUS
INTRAVENOUS | Status: DC | PRN
  Administered 2018-06-03: 20 mg via INTRAVENOUS
  Administered 2018-06-03: 70 mg via INTRAVENOUS

## 2018-06-03 SURGICAL SUPPLY — 20 items
BAG URINE DRAINAGE (UROLOGICAL SUPPLIES) ×3 IMPLANT
BAG URO CATCHER STRL LF (MISCELLANEOUS) ×4 IMPLANT
CATH FOLEY 2WAY SLVR  5CC 18FR (CATHETERS) ×2
CATH FOLEY 2WAY SLVR 5CC 18FR (CATHETERS) ×1 IMPLANT
CATH URET 5FR 28IN OPEN ENDED (CATHETERS) ×3 IMPLANT
CLOTH BEACON ORANGE TIMEOUT ST (SAFETY) ×4 IMPLANT
GLOVE BIOGEL M STRL SZ7.5 (GLOVE) ×4 IMPLANT
GLOVE BIOGEL PI IND STRL 9 (GLOVE) ×1 IMPLANT
GLOVE BIOGEL PI INDICATOR 9 (GLOVE) ×2
GLOVE ECLIPSE 8.5 STRL (GLOVE) ×3 IMPLANT
GOWN STRL REUS W/TWL LRG LVL3 (GOWN DISPOSABLE) ×4 IMPLANT
GOWN STRL REUS W/TWL XL LVL3 (GOWN DISPOSABLE) ×4 IMPLANT
GUIDEWIRE STR DUAL SENSOR (WIRE) ×4 IMPLANT
LOOP CUT BIPOLAR 24F LRG (ELECTROSURGICAL) ×3 IMPLANT
MANIFOLD NEPTUNE II (INSTRUMENTS) ×4 IMPLANT
PACK CYSTO (CUSTOM PROCEDURE TRAY) ×4 IMPLANT
SYRINGE IRR TOOMEY STRL 70CC (SYRINGE) ×3 IMPLANT
TUBING CONNECTING 10 (TUBING) ×3 IMPLANT
TUBING CONNECTING 10' (TUBING) ×1
TUBING UROLOGY SET (TUBING) ×4 IMPLANT

## 2018-06-03 NOTE — Op Note (Signed)
Preoperative diagnosis: Bladder tumor, right hydronephrosis Postoperative diagnosis: Bladder tumor, right hydronephrosis  Procedure: TURBT greater than 5 cm  Surgeon: Junious Silk  Anesthesia: General  Indication for procedure: 82 year old female with irritative voiding symptoms and gross hematuria.  CT scan revealed large right bladder mass with right hydronephrosis.  Findings:  On exam and CT, clinical T2/T3 right bladder tumor, palpable at right anterior vaginal wall as a hard indurated mass but not fixed, right hydro and right renal atrophy  On cystoscopy mostly necrotic, high-grade appearing bladder tumor starting from about 7:00 on the right trigone obliterating the right ureteral orifice going all the way up the right bladder wall, but also wrapping around the bladder neck to about the 1 o'clock position.  90% of the bladder tumor was necrotic with the edges more superiorly and posteriorly and more at the bladder neck pink with some bleeding.    After resecting the tumor back to level with the bladder wall, she should have much better bladder volume and less irritative symptoms.  Also, she was at risk for completely occluding the bladder outlet and should have a much better flow. She had good left efflux.  She may need a nephrostomy tube on the right.   We were never able to find the right ureteral orifice.  Description of procedure: After consent was obtained patient brought to the operating room.  After adequate anesthesia she was placed in lithotomy position and prepped and draped in the usual sterile fashion.  A timeout was performed to confirm the patient and procedure.  An exam under anesthesia was performed I then placed the cystoscope and inspected and located the left ureteral orifice and at the right trigone a large tumor began when all the way up the right bladder wall with friable tumor along the bladder neck.  I placed the resectoscope and cleaned off circumferentially the  bladder neck all the way around to about the 1 o'clock position on the left side.  The left inferior bladder neck was clear as well as the left trigone and left ureteral orifice.  I then went superiorly and resected closer to the bladder neck for a few centimeters and found the depth of the bladder wall and brought this in line.  I then went from the trigone up and met those 2 resections.  Much of the tumor remained in the middle and this was necrotic and resected I then went inferior and posterior and found the bladder wall and brought this level up until we were at the far superior edge of the tumor which was resected down to its base.  This finally smoothed off and resected the entire tumor surface but clinically this tumor is unresectable with the resectoscope and loop.  Even after obtaining to specimen jars of chips the tumor is still palpable through the vaginal wall.  Hemostasis was excellent at low pressure.  We investigated for the right ureteral orifice and I even resected further into the right trigone.  We probed with a sensor wire.  She had good left efflux.  She may need a nephrostomy tube.  The bladder was filled and an 50 [place] coud catheter was placed in left to gravity drainage.  Irrigation and drainage was clear.  She was awakened taken to recovery room in stable condition.  Complications: None  Blood loss: 25 mL  Specimens to pathology: #1 bladder tumor #2 bladder tumor base  Drains: 18 French catheter  Disposition: Patient stable to PACU

## 2018-06-03 NOTE — Anesthesia Procedure Notes (Signed)
Procedure Name: LMA Insertion Date/Time: 06/03/2018 4:14 PM Performed by: Raenette Rover, CRNA Pre-anesthesia Checklist: Patient identified, Emergency Drugs available, Patient being monitored and Suction available Patient Re-evaluated:Patient Re-evaluated prior to induction Oxygen Delivery Method: Circle system utilized Preoxygenation: Pre-oxygenation with 100% oxygen Induction Type: IV induction Ventilation: Mask ventilation without difficulty LMA: LMA inserted LMA Size: 4.0 Number of attempts: 1 Placement Confirmation: positive ETCO2,  CO2 detector and breath sounds checked- equal and bilateral Tube secured with: Tape Dental Injury: Teeth and Oropharynx as per pre-operative assessment

## 2018-06-03 NOTE — Anesthesia Postprocedure Evaluation (Signed)
Anesthesia Post Note  Patient: Colleen Hughes  Procedure(s) Performed: TRANSURETHRAL RESECTION OF BLADDER TUMOR (TURBT) (N/A Bladder)     Patient location during evaluation: PACU Anesthesia Type: General Level of consciousness: awake and alert and oriented Pain management: pain level controlled Vital Signs Assessment: post-procedure vital signs reviewed and stable Respiratory status: spontaneous breathing, nonlabored ventilation and respiratory function stable Cardiovascular status: blood pressure returned to baseline and stable Postop Assessment: no apparent nausea or vomiting Anesthetic complications: no    Last Vitals:  Vitals:   06/03/18 1830 06/03/18 1845  BP: (!) 132/92 117/78  Pulse: 89 81  Resp: 15 19  Temp:    SpO2: 99% 99%    Last Pain:  Vitals:   06/03/18 1845  TempSrc:   PainSc: 2                  Feven Alderfer A.

## 2018-06-03 NOTE — Transfer of Care (Signed)
Immediate Anesthesia Transfer of Care Note  Patient: Colleen Hughes  Procedure(s) Performed: TRANSURETHRAL RESECTION OF BLADDER TUMOR (TURBT) (N/A Bladder)  Patient Location: PACU  Anesthesia Type:General  Level of Consciousness: awake, alert  and patient cooperative  Airway & Oxygen Therapy: Patient Spontanous Breathing and Patient connected to face mask oxygen  Post-op Assessment: Report given to RN and Post -op Vital signs reviewed and stable  Post vital signs: Reviewed and stable  Last Vitals:  Vitals Value Taken Time  BP 127/67 06/03/2018  6:21 PM  Temp 36.8 C 06/03/2018  6:21 PM  Pulse 87 06/03/2018  6:25 PM  Resp 14 06/03/2018  6:25 PM  SpO2 99 % 06/03/2018  6:25 PM  Vitals shown include unvalidated device data.  Last Pain:  Vitals:   06/03/18 1412  TempSrc:   PainSc: 0-No pain         Complications: No apparent anesthesia complications

## 2018-06-03 NOTE — Interval H&P Note (Signed)
History and Physical Interval Note:  06/03/2018 3:57 PM  [name]  has presented today for surgery, with the diagnosis of BLADDER TUMOR  The various methods of treatment have been discussed with the patient and family. After consideration of risks, benefits and other options for treatment, the patient has consented to  Procedure(s) with comments: TRANSURETHRAL RESECTION OF BLADDER TUMOR (TURBT) (N/A) - ONLY NEEDS 90 MIN FOR ALL PROCEDURES CYSTOSCOPY WITH STENT REPLACEMENT (Right) as a surgical intervention .  Pt without fever but she has urgency and weak stream. She has hematuria and clots. The patient's history has been reviewed, patient examined, no change in status, stable for surgery.  I have reviewed the patient's chart and labs.  Questions were answered to the patient's and family's satisfaction.  Discussed with patient and her family concern for bladder cancer, need for staged procedure is likely and possible future treatments include further TUR, chemo, radiation and cystectomy. They are slow to come to terms with the situation.    Colleen Hughes

## 2018-06-03 NOTE — Discharge Instructions (Signed)
Indwelling Urinary Catheter Care, Adult Take good care of your catheter to keep it working and to prevent problems. How to wear your catheter Attach your catheter to your leg with tape (adhesive tape) or a leg strap. Make sure it is not too tight. If you use tape, remove any bits of tape that are already on the catheter. How to wear a drainage bag You should have:  A large overnight bag.  A small leg bag.  Overnight Bag You may wear the overnight bag at any time. Always keep the bag below the level of your bladder but off the floor. When you sleep, put a clean plastic bag in a wastebasket. Then hang the bag inside the wastebasket. Leg Bag Never wear the leg bag at night. Always wear the leg bag below your knee. Keep the leg bag secure with a leg strap or tape. How to care for your skin  Clean the skin around the catheter at least once every day.  Shower every day. Do not take baths.  Put creams, lotions, or ointments on your genital area only as told by your doctor.  Do not use powders, sprays, or lotions on your genital area. How to clean your catheter and your skin 1. Wash your hands with soap and water. 2. Wet a washcloth in warm water and gentle (mild) soap. 3. Use the washcloth to clean the skin where the catheter enters your body. Clean downward and wipe away from the catheter in small circles. Do not wipe toward the catheter. 4. Pat the area dry with a clean towel. Make sure to clean off all soap. How to care for your drainage bags Empty your drainage bag when it is ?- full or at least 2-3 times a day. Replace your drainage bag once a month or sooner if it starts to smell bad or look dirty. Do not clean your drainage bag unless told by your doctor. Emptying a drainage bag  Supplies Needed  Rubbing alcohol.  Gauze pad or cotton ball.  Tape or a leg strap.  Steps 1. Wash your hands with soap and water. 2. Separate (detach) the bag from your leg. 3. Hold the bag over  the toilet or a clean container. Keep the bag below your hips and bladder. This stops pee (urine) from going back into the tube. 4. Open the pour spout at the bottom of the bag. 5. Empty the pee into the toilet or container. Do not let the pour spout touch any surface. 6. Put rubbing alcohol on a gauze pad or cotton ball. 7. Use the gauze pad or cotton ball to clean the pour spout. 8. Close the pour spout. 9. Attach the bag to your leg with tape or a leg strap. 10. Wash your hands.  Changing a drainage bag Supplies Needed  Alcohol wipes.  A clean drainage bag.  Adhesive tape or a leg strap.  Steps 1. Wash your hands with soap and water. 2. Separate the dirty bag from your leg. 3. Pinch the rubber catheter with your fingers so that pee does not spill out. 4. Separate the catheter tube from the drainage tube where these tubes connect (at the connection valve). Do not let the tubes touch any surface. 5. Clean the end of the catheter tube with an alcohol wipe. Use a different alcohol wipe to clean the end of the drainage tube. 6. Connect the catheter tube to the drainage tube of the clean bag. 7. Attach the new bag to  the leg with adhesive tape or a leg strap. 8. Wash your hands.  How to prevent infection and other problems  Never pull on your catheter or try to remove it. Pulling can damage tissue in your body.  Always wash your hands before and after touching your catheter.  If a leg strap gets wet, replace it with a dry one.  Drink enough fluids to keep your pee clear or pale yellow, or as told by your doctor.  Do not let the drainage bag or tubing touch the floor.  Wear cotton underwear.  If you are female, wipe from front to back after you poop (have a bowel movement).  Check on the catheter often to make sure it works and the tubing is not twisted. Get help if:  Your pee is cloudy.  Your pee smells unusually bad.  Your pee is not draining into the bag.  Your  tube gets clogged.  Your catheter starts to leak.  Your bladder feels full. Get help right away if:  You have redness, swelling, or pain where the catheter enters your body.  You have fluid, pus, or a bad smell coming from the area where the catheter enters your body.  The area where the catheter enters your body feels warm.  You have a fever.  You have pain in your: ? Stomach (abdomen). ? Legs. ? Lower back. ? Bladder.  You see blood fill the catheter.  Your pee is pink or red.  You feel sick to your stomach (nauseous).  You throw up (vomit).  You have chills.  Your catheter gets pulled out. This information is not intended to replace advice given to you by your health care provider. Make sure you discuss any questions you have with your health care provider. Document Released: 01/27/2013 Document Revised: 08/30/2016 Document Reviewed: 03/17/2014 Elsevier Interactive Patient Education  2018 [employer]. Cystoscopy, Care After Refer to this sheet in the next few weeks. These instructions provide you with information about caring for yourself after your procedure. Your health care provider may also give you more specific instructions. Your treatment has been planned according to current medical practices, but problems sometimes occur. Call your health care provider if you have any problems or questions after your procedure. What can I expect after the procedure? After the procedure, it is common to have:  Mild pain when you urinate. Pain should stop within a few minutes after you urinate. This may last for up to 1 week.  A small amount of blood in your urine for several days.  Feeling like you need to urinate but producing only a small amount of urine.  Follow these instructions at home:  Medicines  Take over-the-counter and prescription medicines only as told by your health care provider.  If you were prescribed an antibiotic medicine, take it as told by your  health care provider. Do not stop taking the antibiotic even if you start to feel better. General instructions   Return to your normal activities as told by your health care provider. Ask your health care provider what activities are safe for you.  Do not drive for 24 hours if you received a sedative.  Watch for any blood in your urine. If the amount of blood in your urine increases, call your health care provider.  Follow instructions from your health care provider about eating or drinking restrictions.  If a tissue sample was removed for testing (biopsy) during your procedure, it is your responsibility to get  your test results. Ask your health care provider or the department performing the test when your results will be ready.  Drink enough fluid to keep your urine clear or pale yellow.  Keep all follow-up visits as told by your health care provider. This is important. Contact a health care provider if:  You have pain that gets worse or does not get better with medicine, especially pain when you urinate.  You have difficulty urinating. Get help right away if:  You have more blood in your urine.  You have blood clots in your urine.  You have abdominal pain.  You have a fever or chills.  You are unable to urinate. This information is not intended to replace advice given to you by your health care provider. Make sure you discuss any questions you have with your health care provider. Document Released: 04/21/2005 Document Revised: 03/09/2016 Document Reviewed: 08/19/2015 Elsevier Interactive Patient Education  [employer].

## 2018-06-03 NOTE — Anesthesia Preprocedure Evaluation (Addendum)
Anesthesia Evaluation  Patient identified by MRN, date of birth, ID band Patient awake    Reviewed: Allergy & Precautions, NPO status , Patient's Chart, lab work & pertinent test results  Airway Mallampati: II  TM Distance: >3 FB Neck ROM: Full    Dental  (+) Lower Dentures, Upper Dentures   Pulmonary neg pulmonary ROS,    Pulmonary exam normal breath sounds clear to auscultation       Cardiovascular negative cardio ROS Normal cardiovascular exam Rhythm:Regular Rate:Normal  EKG- NSR with PVC's, poor R wave progression in V leads   Neuro/Psych negative neurological ROS  negative psych ROS   GI/Hepatic negative GI ROS, Neg liver ROS,   Endo/Other  negative endocrine ROS  Renal/GU Renal InsufficiencyRenal disease   Bladder Ca with right hydroureteronephrosis    Musculoskeletal  (+) Arthritis , Hands   Abdominal   Peds  Hematology  (+) anemia ,   Anesthesia Other Findings   Reproductive/Obstetrics                           Anesthesia Physical Anesthesia Plan  ASA: II  Anesthesia Plan: General   Post-op Pain Management:    Induction: Intravenous  PONV Risk Score and Plan: 4 or greater and Ondansetron, Dexamethasone and Treatment may vary due to age or medical condition  Airway Management Planned: LMA  Additional Equipment:   Intra-op Plan:   Post-operative Plan: Extubation in OR  Informed Consent: I have reviewed the patients History and Physical, chart, labs and discussed the procedure including the risks, benefits and alternatives for the proposed anesthesia with the patient or authorized representative who has indicated his/her understanding and acceptance.   Dental advisory given  Plan Discussed with: CRNA and Surgeon  Anesthesia Plan Comments:         Anesthesia Quick Evaluation

## 2018-06-04 ENCOUNTER — Encounter (HOSPITAL_COMMUNITY): Payer: Self-pay | Admitting: Urology

## 2018-06-12 ENCOUNTER — Telehealth: Payer: Self-pay | Admitting: Oncology

## 2018-06-12 ENCOUNTER — Encounter: Payer: Self-pay | Admitting: Oncology

## 2018-06-12 NOTE — Telephone Encounter (Signed)
New referral received from Dr. Pilar Jarvis at [facility] for urothelial carcinoma. Pt has been scheduled to see Dr. Alen Blew on 9/10 at 11am. Pt aware to arriv 30 minutes early. Letter mailed.

## 2018-06-25 ENCOUNTER — Telehealth: Payer: Self-pay

## 2018-06-25 ENCOUNTER — Inpatient Hospital Stay: Payer: Medicare Other | Attending: Oncology | Admitting: Oncology

## 2018-06-25 VITALS — BP 136/87 | HR 101 | Temp 98.6°F | Resp 18 | Ht 68.0 in | Wt 105.3 lb

## 2018-06-25 DIAGNOSIS — C679 Malignant neoplasm of bladder, unspecified: Secondary | ICD-10-CM | POA: Diagnosis not present

## 2018-06-25 DIAGNOSIS — R944 Abnormal results of kidney function studies: Secondary | ICD-10-CM | POA: Diagnosis not present

## 2018-06-25 DIAGNOSIS — N1330 Unspecified hydronephrosis: Secondary | ICD-10-CM

## 2018-06-25 DIAGNOSIS — N289 Disorder of kidney and ureter, unspecified: Secondary | ICD-10-CM | POA: Diagnosis not present

## 2018-06-25 NOTE — Telephone Encounter (Signed)
Printed avs and calender of upcoming appointment/. Per 9/10 los 

## 2018-06-25 NOTE — Progress Notes (Signed)
Reason for the request: Bladder cancer     HPI: I was asked by Dr. Junious Silk to evaluate Colleen Hughes for recent diagnosis of bladder cancer.  She is a pleasant 82 year old woman any significant comorbid conditions.  She started developing symptoms of hematuria, frequency and urgency for the last 3 months.  She subsequently was seen in the emergency department on 05/08/2017 for dysuria and hematuria and was diagnosed with urinary tract infection.  She was subsequently evaluated by Dr. Junious Silk and underwent scan on May 17, 2017 which showed a right bladder mass and right hydronephrosis indicating possible T3 bladder cancer.  She underwent cystoscopy and TURBT of a large tumor that is obliterating the ureteral orifice.  This was completed in June 03, 2018 with 90% of the tumor removed.  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  Based on these findings patient referred to me for evaluation for treatment options.  Clinically, she continues to have issues with urination.  She has issues with frequency and dysuria.  She also has fluctuations with urinary obstruction and voiding difficulties.  She reports overnight she had to go to the bathroom every 30 minutes.  She has lost some weight associated with this but remains active and continues to work full-time.  She denies any flank pain.   She does not report any headaches, blurry vision, syncope or seizures. Does not report any fevers, chills or sweats.  Does not report any cough, wheezing or hemoptysis.  Does not report any chest pain, palpitation, orthopnea or leg edema.  Does not report any nausea, vomiting or abdominal pain.  Does not report any constipation or diarrhea.  Does not report any skeletal complaints.    Does not report frequency, urgency or hematuria.  Does not report any skin rashes or lesions. Does not report any heat or cold intolerance.  Does not  report any lymphadenopathy or petechiae.  Does not report any anxiety or depression.  Remaining review of systems is negative.    Past Medical History:  Diagnosis Date  . Arthritis    hands  :  Past Surgical History:  Procedure Laterality Date  . ABDOMINAL HYSTERECTOMY  [id number]   partial  . basal cell skin cancer area removed  2014   left side of neck behind ear  . BREAST ENHANCEMENT SURGERY Left mid 1980,s  . COLONOSCOPY WITH PROPOFOL N/A 01/27/2014   Procedure: COLONOSCOPY WITH PROPOFOL;  Surgeon: Garlan Fair, MD;  Location: [facility];  Service: Endoscopy;  Laterality: N/A;  . TRANSURETHRAL RESECTION OF BLADDER TUMOR N/A 06/03/2018   Procedure: TRANSURETHRAL RESECTION OF BLADDER TUMOR (TURBT);  Surgeon: Festus Aloe, MD;  Location: [facility];  Service: Urology;  Laterality: N/A;  ONLY NEEDS 90 MIN FOR ALL PROCEDURES  :   Current Outpatient Medications:  .  acetaminophen (TYLENOL) 500 MG tablet, Take 500 mg by mouth every 6 (six) hours as needed., Disp: , Rfl:  .  aspirin EC 81 MG tablet, Take 81 mg by mouth daily., Disp: , Rfl:  .  calcium citrate-vitamin D (CITRACAL+D) 315-200 MG-UNIT per tablet, Take 2 tablets by mouth daily., Disp: , Rfl:  .  Cholecalciferol (VITAMIN D) 2000 UNITS tablet, Take 2,000 Units by mouth every other day., Disp: , Rfl:  .  hydrochlorothiazide (HYDRODIURIL) 25 MG tablet, Take 25 mg by mouth every morning., Disp: , Rfl:  .  Multiple Vitamin (MULTIVITAMIN WITH MINERALS) TABS tablet, Take 1 tablet by mouth  daily., Disp: , Rfl:  .  Naproxen Sodium (ALEVE) 220 MG CAPS, Take by mouth., Disp: , Rfl: :  Allergies  Allergen Reactions  . Codeine Nausea Only  :  No family history on file.:  Social History   Socioeconomic History  . Marital status: Married    Spouse name: Not on file  . Number of children: Not on file  . Years of education: Not on file  . Highest education level: Not on file  Occupational History  . Not on file  Social  Needs  . Financial resource strain: Not on file  . Food insecurity:    Worry: Not on file    Inability: Not on file  . Transportation needs:    Medical: Not on file    Non-medical: Not on file  Tobacco Use  . Smoking status: Never Smoker  . Smokeless tobacco: Never Used  Substance and Sexual Activity  . Alcohol use: Yes    Comment: rare beer  . Drug use: Never  . Sexual activity: Not on file  Lifestyle  . Physical activity:    Days per week: Not on file    Minutes per session: Not on file  . Stress: Not on file  Relationships  . Social connections:    Talks on phone: Not on file    Gets together: Not on file    Attends religious service: Not on file    Active member of club or organization: Not on file    Attends meetings of clubs or organizations: Not on file    Relationship status: Not on file  . Intimate partner violence:    Fear of current or ex partner: Not on file    Emotionally abused: Not on file    Physically abused: Not on file    Forced sexual activity: Not on file  Other Topics Concern  . Not on file  Social History Narrative  . Not on file  :  Pertinent items are noted in HPI.  Exam: Blood pressure 136/87, pulse (!) 101, temperature 98.6 F (37 C), temperature source Oral, resp. rate 18, height 5\' 8"  (1.727 m), weight 105 lb 4.8 oz (47.8 kg), SpO2 98 %.  ECOG 0 General appearance: alert and cooperative appeared without distress. Head: atraumatic without any abnormalities. Eyes: conjunctivae/corneas clear. PERRL.  Sclera anicteric. Throat: lips, mucosa, and tongue normal; without oral thrush or ulcers. Resp: clear to auscultation bilaterally without rhonchi, wheezes or dullness to percussion. Cardio: regular rate and rhythm, S1, S2 normal, no murmur, click, rub or gallop GI: soft, non-tender; bowel sounds normal; no masses,  no organomegaly Skin: Skin color, texture, turgor normal. No rashes or lesions Lymph nodes: Cervical, supraclavicular, and  axillary nodes normal. Neurologic: Grossly normal without any motor, sensory or deep tendon reflexes. Musculoskeletal: No joint deformity or effusion.  CBC    Component Value Date/Time   WBC 19.6 (H) 06/03/2018 1404   RBC 3.82 (L) 06/03/2018 1404   HGB 10.4 (L) 06/03/2018 1404   HCT 33.4 (L) 06/03/2018 1404   PLT 460 (H) 06/03/2018 1404   MCV 87.4 06/03/2018 1404   MCH 27.2 06/03/2018 1404   MCHC 31.1 06/03/2018 1404   RDW 14.6 06/03/2018 1404   LYMPHSABS 3.0 05/08/2018 2009   MONOABS 1.3 (H) 05/08/2018 2009   EOSABS 0.5 05/08/2018 2009   BASOSABS 0.1 05/08/2018 2009     Chemistry      Component Value Date/Time   NA 140 06/03/2018 1404   K 4.3  06/03/2018 1404   CL 105 06/03/2018 1404   CO2 22 06/03/2018 1404   BUN 35 (H) 06/03/2018 1404   CREATININE 1.80 (H) 06/03/2018 1404      Component Value Date/Time   CALCIUM 8.8 (L) 06/03/2018 1404   ALKPHOS 52 05/08/2018 2009   AST 16 05/08/2018 2009   ALT 11 05/08/2018 2009   BILITOT 0.6 05/08/2018 2009       Assessment and Plan:   82 year old woman with the following:  1.  High-grade poorly differentiated urothelial carcinoma involving the muscularis propria indicating at least T2 or T3N0 of the bladder diagnosed in August 2019.  Imaging studies showed organ confined tumor with right-sided hydronephrosis.  The natural course of this disease and treatment options were reviewed today with the patient and her Hughes.  Radical cystectomy still offers her the best curative option and despite her age, she remains in excellent performance status and would be a good candidate to have this operation.  The role for neoadjuvant chemotherapy utilizing cisplatin based chemotherapy was discussed.  Given her history, and creatinine clearance of less than 30 cc/min, she would not be a candidate for cisplatin based chemotherapy.  It is certainly possible that with a nephrostomy tube she could have some improvement in her renal function although  that is not guaranteed at this time.  I fear that she is very symptomatic at this time from her cancer and I think delaying definitive surgery would likely cause more decline in her quality of life and more symptoms related to the cancer.  Alternatively, radiation therapy concomitantly with chemotherapy can offer an inferior option in this case.  This option will not address her urinary symptoms which are troublesome for her.  I fear this is an inferior option for her as it will not address her symptoms and likely would not be curative.  After discussion today, my final recommendation is to proceed with radical cystectomy as soon as possible under the care of Dr. Junious Silk or his colleagues and I will evaluate her postoperatively for possible chemotherapy adjuvantly depending on her kidney function at that time.  2.  Renal insufficiency: Related to right-sided hydronephrosis.  Nephrostomy tube can potentially improve her renal drainage but I do not think this is the best approach.  I favor proceeding with radical cystectomy as soon as possible.  3.  Follow-up: We will be in 2 to 3 months to follow her progress.  Thank you for the referral.  A copy of this consult has been forwarded to the requesting physician.

## 2018-07-09 ENCOUNTER — Other Ambulatory Visit: Payer: Self-pay | Admitting: Urology

## 2018-07-20 ENCOUNTER — Emergency Department (HOSPITAL_COMMUNITY)
Admission: EM | Admit: 2018-07-20 | Discharge: 2018-07-20 | Disposition: A | Discharge status: Home / Self Care | Payer: Medicare Other | Source: Home / Self Care | Attending: Emergency Medicine | Admitting: Emergency Medicine

## 2018-07-20 ENCOUNTER — Other Ambulatory Visit: Payer: Self-pay

## 2018-07-20 ENCOUNTER — Encounter (HOSPITAL_COMMUNITY): Payer: Self-pay

## 2018-07-20 DIAGNOSIS — N3090 Cystitis, unspecified without hematuria: Secondary | ICD-10-CM

## 2018-07-20 DIAGNOSIS — C679 Malignant neoplasm of bladder, unspecified: Secondary | ICD-10-CM

## 2018-07-20 DIAGNOSIS — Z79899 Other long term (current) drug therapy: Secondary | ICD-10-CM | POA: Insufficient documentation

## 2018-07-20 DIAGNOSIS — Z7982 Long term (current) use of aspirin: Secondary | ICD-10-CM

## 2018-07-20 DIAGNOSIS — R339 Retention of urine, unspecified: Secondary | ICD-10-CM

## 2018-07-20 LAB — CBC WITH DIFFERENTIAL/PLATELET
Basophils Absolute: 0 10*3/uL (ref 0.0–0.1)
Basophils Relative: 0 %
EOS ABS: 0.3 10*3/uL (ref 0.0–0.7)
EOS PCT: 1 %
HCT: 35 % — ABNORMAL LOW (ref 36.0–46.0)
Hemoglobin: 11.1 g/dL — ABNORMAL LOW (ref 12.0–15.0)
LYMPHS ABS: 2.1 10*3/uL (ref 0.7–4.0)
LYMPHS PCT: 9 %
MCH: 27.1 pg (ref 26.0–34.0)
MCHC: 31.7 g/dL (ref 30.0–36.0)
MCV: 85.4 fL (ref 78.0–100.0)
MONO ABS: 1.2 10*3/uL — AB (ref 0.1–1.0)
MONOS PCT: 5 %
Neutro Abs: 19.2 10*3/uL — ABNORMAL HIGH (ref 1.7–7.7)
Neutrophils Relative %: 85 %
PLATELETS: 542 10*3/uL — AB (ref 150–400)
RBC: 4.1 MIL/uL (ref 3.87–5.11)
RDW: 15.3 % (ref 11.5–15.5)
WBC: 22.8 10*3/uL — AB (ref 4.0–10.5)

## 2018-07-20 LAB — I-STAT CHEM 8, ED
BUN: 38 mg/dL — ABNORMAL HIGH (ref 8–23)
CALCIUM ION: 1.14 mmol/L — AB (ref 1.15–1.40)
Chloride: 102 mmol/L (ref 98–111)
Creatinine, Ser: 1.4 mg/dL — ABNORMAL HIGH (ref 0.44–1.00)
GLUCOSE: 105 mg/dL — AB (ref 70–99)
HCT: 36 % (ref 36.0–46.0)
Hemoglobin: 12.2 g/dL (ref 12.0–15.0)
Potassium: 4.1 mmol/L (ref 3.5–5.1)
SODIUM: 136 mmol/L (ref 135–145)
TCO2: 23 mmol/L (ref 22–32)

## 2018-07-20 LAB — URINALYSIS, ROUTINE W REFLEX MICROSCOPIC
BILIRUBIN URINE: NEGATIVE
Glucose, UA: NEGATIVE mg/dL
KETONES UR: NEGATIVE mg/dL
NITRITE: NEGATIVE
PROTEIN: 100 mg/dL — AB
Specific Gravity, Urine: 1.012 (ref 1.005–1.030)
WBC, UA: >50 WBC/hpf — ABNORMAL HIGH (ref 0–5)
pH: 7 (ref 5.0–8.0)

## 2018-07-20 MED ORDER — FOSFOMYCIN TROMETHAMINE 3 G PO PACK
3.0000 g | PACK | Freq: Once | ORAL | Status: AC
  Administered 2018-07-20: 3 g via ORAL
  Filled 2018-07-20: qty 3

## 2018-07-20 NOTE — Discharge Instructions (Signed)
The urine has some markers for infection. To be on the safe side, given that you are having a surgery soon we will give you one round of medication that should take care of an infection.  We wish you the very best with the upcoming surgery.  Please return to the ER if your symptoms worsen; you have increased pain, fevers, chills, inability to keep any medications down, confusion. Otherwise see the outpatient doctor as requested.

## 2018-07-20 NOTE — ED Triage Notes (Signed)
Pt has hx of bladder cancer. Pt had indwelling foley cath placed approx 2.5 weeks.Pt states she emptied her bag at 0630. Since then, she has not had any output. Pt states that she noticed a "strange smell" to her urine. Pt states she also noticed objects floating in her urine prior to stoppage. Pt is scheduled for bladder surgery Wednesday 07/24/18

## 2018-07-20 NOTE — ED Provider Notes (Signed)
[place] DEPT Provider Note   CSN: [id number] Arrival date & time: 07/20/18  1443     History   Chief Complaint Chief Complaint  Patient presents with  . Dysuria    HPI Colleen Hughes is a 82 y.o. female.  HPI 82 year old female comes in with chief complaint of abdominal pain. Patient reports that she has not had any urinary output since last night, and now she is having suprapubic pain and distention. Patient has history of bladder cancer and has a Foley catheter in place for the past 2-1/2 weeks. Review of systems negative for fevers or chills.   Past Medical History:  Diagnosis Date  . Arthritis    hands    There are no active problems to display for this patient.   Past Surgical History:  Procedure Laterality Date  . ABDOMINAL HYSTERECTOMY  [id number]'C   partial  . basal cell skin cancer area removed  2014   left side of neck behind ear  . BREAST ENHANCEMENT SURGERY Left mid 1980,s  . COLONOSCOPY WITH PROPOFOL N/A 01/27/2014   Procedure: COLONOSCOPY WITH PROPOFOL;  Surgeon: Garlan Fair, MD;  Location: [facility];  Service: Endoscopy;  Laterality: N/A;  . TRANSURETHRAL RESECTION OF BLADDER TUMOR N/A 06/03/2018   Procedure: TRANSURETHRAL RESECTION OF BLADDER TUMOR (TURBT);  Surgeon: Festus Aloe, MD;  Location: [facility];  Service: Urology;  Laterality: N/A;  ONLY NEEDS 90 MIN FOR ALL PROCEDURES     OB History   None      Home Medications    Prior to Admission medications   Medication Sig Start Date End Date Taking? Authorizing Provider  acetaminophen (TYLENOL) 500 MG tablet Take 500-1,000 mg by mouth every 6 (six) hours as needed for moderate pain or headache.    Yes [provider]  aspirin EC 81 MG tablet Take 81 mg by mouth daily.   Yes [provider]  Calcium-Phosphorus-Vitamin D (CITRACAL +D3 PO) Take 2 tablets by mouth daily.   Yes [provider]  Cholecalciferol (VITAMIN D3) 2000  units TABS Take 2,000 Units by mouth daily.   Yes [provider]  Multiple Vitamin (MULTIVITAMIN WITH MINERALS) TABS tablet Take 1 tablet by mouth daily. [employer] Multivitamin   Yes [provider]  triamterene-hydrochlorothiazide (MAXZIDE-25) 37.5-25 MG tablet Take 1 tablet by mouth daily.   Yes [provider]    Family History No family history on file.  Social History Social History   Tobacco Use  . Smoking status: Never Smoker  . Smokeless tobacco: Never Used  Substance Use Topics  . Alcohol use: Yes    Comment: rare beer  . Drug use: Never     Allergies   Codeine   Review of Systems Review of Systems  All other systems reviewed and are negative.    Physical Exam Updated Vital Signs BP (!) 143/85 (BP Location: Left Arm)   Pulse (!) 118   Temp 99.3 F (37.4 C) (Oral)   Resp 16   Ht 5\' 6"  (1.676 m)   Wt 48.1 kg   SpO2 98%   BMI 17.11 kg/m   Physical Exam  Constitutional: She is oriented to person, place, and time. She appears well-developed.  HENT:  Head: Normocephalic and atraumatic.  Eyes: EOM are normal.  Neck: Normal range of motion. Neck supple.  Cardiovascular: Normal rate.  Pulmonary/Chest: Effort normal.  Abdominal: Bowel sounds are normal. She exhibits distension. There is tenderness.  Neurological: She is alert  and oriented to person, place, and time.  Skin: Skin is warm and dry.  Nursing note and vitals reviewed.    ED Treatments / Results  Labs (all labs ordered are listed, but only abnormal results are displayed) Labs Reviewed  CBC WITH DIFFERENTIAL/PLATELET - Abnormal; Notable for the following components:      Result Value   WBC 22.8 (*)    Hemoglobin 11.1 (*)    HCT 35.0 (*)    Platelets 542 (*)    Neutro Abs 19.2 (*)    Monocytes Absolute 1.2 (*)    All other components within normal limits  URINALYSIS, ROUTINE W REFLEX MICROSCOPIC - Abnormal; Notable for the following components:    APPearance CLOUDY (*)    Hgb urine dipstick MODERATE (*)    Protein, ur 100 (*)    Leukocytes, UA LARGE (*)    WBC, UA >50 (*)    Bacteria, UA MANY (*)    All other components within normal limits  I-STAT CHEM 8, ED - Abnormal; Notable for the following components:   BUN 38 (*)    Creatinine, Ser 1.40 (*)    Glucose, Bld 105 (*)    Calcium, Ion 1.14 (*)    All other components within normal limits    EKG None  Radiology No results found.  Procedures Procedures (including critical care time)  Medications Ordered in ED Medications  fosfomycin (MONUROL) packet 3 g (3 g Oral Given 07/20/18 1732)     Initial Impression / Assessment and Plan / ED Course  I have reviewed the triage vital signs and the nursing notes.  Pertinent labs & imaging results that were available during my care of the patient were reviewed by me and considered in my medical decision making (see chart for details).  Clinical Course as of Jul 21 1739  Sat Jul 20, 2018  1739 Patient's white count is 22.8.  Along with that her UA is showing many bacteria, many WBCs and large leukocytes.  Given that patient is supposed to get surgery soon, we will give her a dose of fosfomycin. Based on our evaluation.  Patient is not septic.   Patient's heart rate, which was 118 at arrival has improved to 88 post Foley catheter replacement.  WBC(!): 22.8 [AN]    Clinical Course User Index [AN] Varney Biles, MD    82 year old female comes in with chief complaint of abdominal distention.  She has history of bladder cancer and has chronic indwelling Foley catheter. It seems like her symptoms are mainly because of urinary retention.  We managed to exchange the Foley catheter.   Final Clinical Impressions(s) / ED Diagnoses   Final diagnoses:  Cystitis  Urinary retention    ED Discharge Orders    None       Varney Biles, MD 07/20/18 1740

## 2018-07-23 ENCOUNTER — Inpatient Hospital Stay (HOSPITAL_COMMUNITY)
Admission: AD | Admit: 2018-07-23 | Discharge: 2018-07-30 | DRG: 653 | Disposition: A | Discharge status: Home Health | Payer: Medicare Other | Attending: Urology | Admitting: Urology

## 2018-07-23 ENCOUNTER — Other Ambulatory Visit: Payer: Self-pay

## 2018-07-23 ENCOUNTER — Encounter (HOSPITAL_COMMUNITY): Payer: Self-pay

## 2018-07-23 DIAGNOSIS — M19041 Primary osteoarthritis, right hand: Secondary | ICD-10-CM | POA: Diagnosis present

## 2018-07-23 DIAGNOSIS — Q6101 Congenital single renal cyst: Secondary | ICD-10-CM

## 2018-07-23 DIAGNOSIS — D62 Acute posthemorrhagic anemia: Secondary | ICD-10-CM | POA: Diagnosis not present

## 2018-07-23 DIAGNOSIS — Z906 Acquired absence of other parts of urinary tract: Secondary | ICD-10-CM

## 2018-07-23 DIAGNOSIS — Z79899 Other long term (current) drug therapy: Secondary | ICD-10-CM

## 2018-07-23 DIAGNOSIS — Z960 Presence of urogenital implants: Secondary | ICD-10-CM | POA: Diagnosis present

## 2018-07-23 DIAGNOSIS — E43 Unspecified severe protein-calorie malnutrition: Secondary | ICD-10-CM | POA: Diagnosis present

## 2018-07-23 DIAGNOSIS — Z681 Body mass index (BMI) 19 or less, adult: Secondary | ICD-10-CM

## 2018-07-23 DIAGNOSIS — Z885 Allergy status to narcotic agent status: Secondary | ICD-10-CM

## 2018-07-23 DIAGNOSIS — M19042 Primary osteoarthritis, left hand: Secondary | ICD-10-CM | POA: Diagnosis present

## 2018-07-23 DIAGNOSIS — K567 Ileus, unspecified: Secondary | ICD-10-CM | POA: Diagnosis not present

## 2018-07-23 DIAGNOSIS — C7919 Secondary malignant neoplasm of other urinary organs: Secondary | ICD-10-CM | POA: Diagnosis present

## 2018-07-23 DIAGNOSIS — C679 Malignant neoplasm of bladder, unspecified: Principal | ICD-10-CM | POA: Diagnosis present

## 2018-07-23 DIAGNOSIS — Z7982 Long term (current) use of aspirin: Secondary | ICD-10-CM

## 2018-07-23 DIAGNOSIS — Z90711 Acquired absence of uterus with remaining cervical stump: Secondary | ICD-10-CM

## 2018-07-23 DIAGNOSIS — Z85828 Personal history of other malignant neoplasm of skin: Secondary | ICD-10-CM | POA: Diagnosis not present

## 2018-07-23 LAB — COMPREHENSIVE METABOLIC PANEL
ALT: 9 U/L (ref 0–44)
ANION GAP: 12 (ref 5–15)
AST: 13 U/L — ABNORMAL LOW (ref 15–41)
Albumin: 3.3 g/dL — ABNORMAL LOW (ref 3.5–5.0)
Alkaline Phosphatase: 42 U/L (ref 38–126)
BILIRUBIN TOTAL: 0.9 mg/dL (ref 0.3–1.2)
BUN: 42 mg/dL — ABNORMAL HIGH (ref 8–23)
CO2: 25 mmol/L (ref 22–32)
Calcium: 9.4 mg/dL (ref 8.9–10.3)
Chloride: 102 mmol/L (ref 98–111)
Creatinine, Ser: 1.49 mg/dL — ABNORMAL HIGH (ref 0.44–1.00)
GFR calc Af Amer: 37 mL/min — ABNORMAL LOW (ref >60)
GFR, EST NON AFRICAN AMERICAN: 32 mL/min — AB (ref >60)
Glucose, Bld: 95 mg/dL (ref 70–99)
POTASSIUM: 4.1 mmol/L (ref 3.5–5.1)
Sodium: 139 mmol/L (ref 135–145)
TOTAL PROTEIN: 6.5 g/dL (ref 6.5–8.1)

## 2018-07-23 LAB — CBC
HEMATOCRIT: 32.5 % — AB (ref 36.0–46.0)
Hemoglobin: 10 g/dL — ABNORMAL LOW (ref 12.0–15.0)
MCH: 26.5 pg (ref 26.0–34.0)
MCHC: 30.8 g/dL (ref 30.0–36.0)
MCV: 86 fL (ref 80.0–100.0)
PLATELETS: 477 10*3/uL — AB (ref 150–400)
RBC: 3.78 MIL/uL — ABNORMAL LOW (ref 3.87–5.11)
RDW: 14.9 % (ref 11.5–15.5)
WBC: 20.7 10*3/uL — AB (ref 4.0–10.5)
nRBC: 0 % (ref 0.0–0.2)

## 2018-07-23 LAB — PREPARE RBC (CROSSMATCH)

## 2018-07-23 LAB — ABO/RH: ABO/RH(D): O NEG

## 2018-07-23 MED ORDER — PIPERACILLIN-TAZOBACTAM 3.375 G IVPB 30 MIN
3.3750 g | INTRAVENOUS | Status: AC
  Administered 2018-07-24: 3.375 g via INTRAVENOUS
  Filled 2018-07-23 (×2): qty 50

## 2018-07-23 MED ORDER — METRONIDAZOLE 500 MG PO TABS
500.0000 mg | ORAL_TABLET | ORAL | Status: AC
  Administered 2018-07-23: 500 mg via ORAL
  Filled 2018-07-23: qty 1

## 2018-07-23 MED ORDER — SODIUM CHLORIDE 0.9 % IV SOLN
INTRAVENOUS | Status: DC
  Administered 2018-07-23: 13:00:00 via INTRAVENOUS

## 2018-07-23 MED ORDER — SODIUM CHLORIDE 0.9% IV SOLUTION: Freq: Once | INTRAVENOUS | Status: DC

## 2018-07-23 MED ORDER — NEOMYCIN SULFATE 500 MG PO TABS
500.0000 mg | ORAL_TABLET | ORAL | Status: AC
  Administered 2018-07-23: 500 mg via ORAL
  Filled 2018-07-23 (×2): qty 1

## 2018-07-23 MED ORDER — PEG 3350-KCL-NA BICARB-NACL 420 G PO SOLR
4000.0000 mL | Freq: Once | ORAL | Status: AC
  Administered 2018-07-23: 4000 mL via ORAL

## 2018-07-23 NOTE — Anesthesia Preprocedure Evaluation (Addendum)
Anesthesia Evaluation  Patient identified by MRN, date of birth, ID band Patient awake    Reviewed: Allergy & Precautions, NPO status , Patient's Chart, lab work & pertinent test results  Airway Mallampati: II  TM Distance: >3 FB Neck ROM: Full    Dental  (+) Dental Advisory Given, Lower Dentures, Upper Dentures   Pulmonary neg pulmonary ROS,    Pulmonary exam normal breath sounds clear to auscultation       Cardiovascular hypertension, Pt. on medications Normal cardiovascular exam Rhythm:Regular Rate:Normal     Neuro/Psych negative neurological ROS     GI/Hepatic negative GI ROS, Neg liver ROS,   Endo/Other  negative endocrine ROS  Renal/GU negative Renal ROS   Bladder cancer    Musculoskeletal negative musculoskeletal ROS (+) Arthritis ,   Abdominal   Peds  Hematology  (+) Blood dyscrasia, anemia ,   Anesthesia Other Findings Day of surgery medications reviewed with the patient.  Reproductive/Obstetrics                            Anesthesia Physical Anesthesia Plan  ASA: IV  Anesthesia Plan: General   Post-op Pain Management:    Induction: Intravenous  PONV Risk Score and Plan: 4 or greater and Dexamethasone, Ondansetron and Treatment may vary due to age or medical condition  Airway Management Planned: Oral ETT  Additional Equipment: Arterial line  Intra-op Plan:   Post-operative Plan: Possible Post-op intubation/ventilation  Informed Consent: I have reviewed the patients History and Physical, chart, labs and discussed the procedure including the risks, benefits and alternatives for the proposed anesthesia with the patient or authorized representative who has indicated his/her understanding and acceptance.   Dental advisory given  Plan Discussed with: CRNA  Anesthesia Plan Comments: (Arterial line, 2nd large bore PIV.)      Anesthesia Quick Evaluation

## 2018-07-23 NOTE — Consult Note (Signed)
[employer] Nurse requested for preoperative stoma site marking  Discussed surgical procedure and stoma creation with patient. She is very well educated on the surgical procedure.  Explained role of the [place] nurse team.  Provided the patient with educational booklet and provided samples of pouching options.  Answered patient questions about pouching.  Examined patient lying, sitting, and standing in order to place the marking in the patient's visual field, away from any creases or abdominal contour issues and within the rectus muscle.  Attempted to mark below the patient's belt line.   Marked for ileal conduit in the RLQ __2_  cm to the right of the umbilicus and  ___7_ cm below the umbilicus.   Patient's abdomen cleansed with CHG wipes at site markings, allowed to air dry prior to marking.Covered mark with thin film transparent dressing to preserve mark until date of surgery.   [place] Nurse team will follow up with patient after surgery for continue ostomy care and teaching.  [place] MSN, [place], [place], [place]

## 2018-07-23 NOTE — H&P (Signed)
Colleen Hughes is an 82 y.o. female.    Chief Complaint: Pre-Op Cystectomy.   HPI:   1 - Muscle Invasive Bladder Cancer - at least T2 (likely T3-T4) high grade bladder cancer with right malignant obstruction by TURBT 05/2018, found on eval gross hematuria and recurrent clot retention. CT with malignant right hydro and significnant soft tissue stranding nodularity arougn right trigone and suspect vaginal cuff area. No bulky adenopathy. Fat planes preserved. Had opinion from [facility] 06/2018 who agrees up-front surgery likely best option given symptomatic nature of disease and GFR compromise.   2 - Malignant Right Hydronephrosis - severe right hydro from bladder cancer by CT 05/2018. Ct 1.8s.   3 - Left Mildly Renal Cyst - about 2cm left upper cyst with some mild enhancement (around [facility]), no mass effect by CT 05/2018.   4 - Recurrent Clot / Urinary Retention - in and out of urinary retention since initial eval 05/2018 mostly due to clot retention. Now catheter dependant.   PMH sig for mammoplasty, benign hyst. 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 Her PCP is Colleen Orn MD.   Today "Colleen Hughes" is seen as pre-op admission for cystectomy tomorrow. No interval feers. Still having occaiosnal clot retention.     Past Medical History:  Diagnosis Date  . Arthritis    hands    Past Surgical History:  Procedure Laterality Date  . ABDOMINAL HYSTERECTOMY  [id number]   partial  . basal cell skin cancer area removed  2014   left side of neck behind ear  . BREAST ENHANCEMENT SURGERY Left mid 1980,s  . COLONOSCOPY WITH PROPOFOL N/A 01/27/2014   Procedure: COLONOSCOPY WITH PROPOFOL;  Surgeon: Garlan Fair, MD;  Location: [facility];  Service: Endoscopy;  Laterality: N/A;  . TRANSURETHRAL RESECTION OF BLADDER TUMOR N/A 06/03/2018   Procedure: TRANSURETHRAL RESECTION OF BLADDER TUMOR (TURBT);  Surgeon: Festus Aloe, MD;  Location: [facility] OR[facility];  Service: Urology;   Laterality: N/A;  ONLY NEEDS 90 MIN FOR ALL PROCEDURES    History reviewed. No pertinent family history. Social History:  reports that she has never smoked. She has never used smokeless tobacco. She reports that she drinks alcohol. She reports that she does not use drugs.  Allergies:  Allergies  Allergen Reactions  . Codeine Nausea Only    Medications Prior to Admission  Medication Sig Dispense Refill  . acetaminophen (TYLENOL) 500 MG tablet Take 500-1,000 mg by mouth every 6 (six) hours as needed for moderate pain or headache.     Marland Kitchen aspirin EC 81 MG tablet Take 81 mg by mouth daily.    Marland Kitchen triamterene-hydrochlorothiazide (MAXZIDE-25) 37.5-25 MG tablet Take 1 tablet by mouth daily.    . Calcium-Phosphorus-Vitamin D (CITRACAL +D3 PO) Take 2 tablets by mouth daily.    . Cholecalciferol (VITAMIN D3) 2000 units TABS Take 2,000 Units by mouth daily.    . Multiple Vitamin (MULTIVITAMIN WITH MINERALS) TABS tablet Take 1 tablet by mouth daily. [employer] Multivitamin      Results for orders placed or performed during the hospital encounter of 07/23/18 (from the past 48 hour(s))  CBC     Status: Abnormal   Collection Time: 07/23/18 11:51 AM  Result Value Ref Range   WBC 20.7 (H) 4.0 - 10.5 K/uL   RBC 3.78 (L) 3.87 - 5.11 MIL/uL   Hemoglobin 10.0 (L) 12.0 - 15.0 g/dL   HCT 32.5 (L) 36.0 - 46.0 %   MCV  86.0 80.0 - 100.0 fL   MCH 26.5 26.0 - 34.0 pg   MCHC 30.8 30.0 - 36.0 g/dL   RDW 14.9 11.5 - 15.5 %   Platelets 477 (H) 150 - 400 K/uL   nRBC 0.0 0.0 - 0.2 %    Comment: Performed at [facility], [place] [address] [place], [place] [address]  Comprehensive metabolic panel     Status: Abnormal   Collection Time: 07/23/18 11:51 AM  Result Value Ref Range   Sodium 139 135 - 145 mmol/L   Potassium 4.1 3.5 - 5.1 mmol/L   Chloride 102 98 - 111 mmol/L   CO2 25 22 - 32 mmol/L   Glucose, Bld 95 70 - 99 mg/dL   BUN 42 (H) 8 - 23 mg/dL   Creatinine, Ser 1.49 (H) 0.44 - 1.00  mg/dL   Calcium 9.4 8.9 - 10.3 mg/dL   Total Protein 6.5 6.5 - 8.1 g/dL   Albumin 3.3 (L) 3.5 - 5.0 g/dL   AST 13 (L) 15 - 41 U/L   ALT 9 0 - 44 U/L   Alkaline Phosphatase 42 38 - 126 U/L   Total Bilirubin 0.9 0.3 - 1.2 mg/dL   GFR calc non Af Amer 32 (L) >60 mL/min   GFR calc Af Amer 37 (L) >60 mL/min    Comment: (NOTE) The eGFR has been calculated using the CKD EPI equation. This calculation has not been validated in all clinical situations. eGFR's persistently <60 mL/min signify possible Chronic Kidney Disease.    Anion gap 12 5 - 15    Comment: Performed at [facility], [place] [address], [place], [place] [address]   No results found.  Review of Systems  Constitutional: Positive for weight loss. Negative for chills and fever.  HENT: Negative.   Eyes: Negative.   Respiratory: Negative.   Gastrointestinal: Negative.   Genitourinary: Positive for hematuria.  Musculoskeletal: Negative.   Skin: Negative.   Neurological: Negative.   Endo/Heme/Allergies: Negative.   Psychiatric/Behavioral: Negative.     Blood pressure 118/78, pulse 98, temperature 98.7 F (37.1 C), temperature source Oral, resp. rate 18, height '5\' 6"'  (1.676 m), weight 45 kg, SpO2 100 %. Physical Exam  Constitutional: She appears well-developed.  Very metnally astute. This as per baseline.   HENT:  Head: Normocephalic.  Neck: Normal range of motion.  Cardiovascular: Normal rate.  Respiratory: Effort normal.  GI: Soft.  RLQ stomal marking site noted.   Genitourinary:  Genitourinary Comments: Foley in place with clear urine that is non-foul at present. NO CVAT.   Musculoskeletal: Normal range of motion.  Neurological: She is alert.  Skin: Skin is warm.  Psychiatric: She has a normal mood and affect.     Assessment/Plan  Proceed as planned with cytectomy / conduit diversion tomorrow. Risks, benefits, alternatives, expected peri-op course discssed extensively previously and reiterated  today.   CMP, CBC, Bowel prep, 1/2 MIVF, Stomal Marking, Consent, T+C 2 units.   Alexis Frock, MD 07/23/2018, 6:43 PM

## 2018-07-24 ENCOUNTER — Encounter (HOSPITAL_COMMUNITY)
Admission: AD | Disposition: A | Discharge status: Home Health | Payer: Self-pay | Source: Home / Self Care | Attending: Urology

## 2018-07-24 ENCOUNTER — Encounter (HOSPITAL_COMMUNITY): Payer: Self-pay | Admitting: Certified Registered Nurse Anesthetist

## 2018-07-24 ENCOUNTER — Inpatient Hospital Stay (HOSPITAL_COMMUNITY): Payer: Medicare Other | Admitting: Anesthesiology

## 2018-07-24 DIAGNOSIS — C679 Malignant neoplasm of bladder, unspecified: Secondary | ICD-10-CM | POA: Diagnosis present

## 2018-07-24 HISTORY — PX: LYMPHADENECTOMY: SHX5960

## 2018-07-24 HISTORY — PX: ROBOT ASSISTED LAPAROSCOPIC COMPLETE CYSTECT ILEAL CONDUIT: SHX5139

## 2018-07-24 HISTORY — PX: CYSTOSCOPY: SHX5120

## 2018-07-24 LAB — POCT I-STAT 4, (NA,K, GLUC, HGB,HCT)
Glucose, Bld: 196 mg/dL — ABNORMAL HIGH (ref 70–99)
HCT: 27 % — ABNORMAL LOW (ref 36.0–46.0)
Hemoglobin: 9.2 g/dL — ABNORMAL LOW (ref 12.0–15.0)
POTASSIUM: 3.9 mmol/L (ref 3.5–5.1)
SODIUM: 137 mmol/L (ref 135–145)

## 2018-07-24 LAB — MRSA PCR SCREENING: MRSA BY PCR: NEGATIVE

## 2018-07-24 SURGERY — CYSTECTOMY, ROBOT-ASSISTED, WITH ILEAL CONDUIT CREATION
Anesthesia: General

## 2018-07-24 MED ORDER — HYDROMORPHONE HCL 1 MG/ML IJ SOLN
0.5000 mg | INTRAMUSCULAR | Status: DC | PRN
  Administered 2018-07-26 (×2): 1 mg via INTRAVENOUS
  Filled 2018-07-24 (×2): qty 1

## 2018-07-24 MED ORDER — DIPHENHYDRAMINE HCL 50 MG/ML IJ SOLN
12.5000 mg | Freq: Four times a day (QID) | INTRAMUSCULAR | Status: DC | PRN
  Administered 2018-07-24 – 2018-07-25 (×3): 12.5 mg via INTRAVENOUS
  Filled 2018-07-24 (×3): qty 1

## 2018-07-24 MED ORDER — FENTANYL CITRATE (PF) 100 MCG/2ML IJ SOLN
25.0000 ug | INTRAMUSCULAR | Status: DC | PRN
  Administered 2018-07-24 (×2): 50 ug via INTRAVENOUS

## 2018-07-24 MED ORDER — ONDANSETRON HCL 4 MG/2ML IJ SOLN
4.0000 mg | INTRAMUSCULAR | Status: DC | PRN
  Administered 2018-07-25 – 2018-07-29 (×7): 4 mg via INTRAVENOUS
  Filled 2018-07-24 (×7): qty 2

## 2018-07-24 MED ORDER — SODIUM CHLORIDE 0.9 % IJ SOLN
INTRAMUSCULAR | Status: DC | PRN
  Administered 2018-07-24: 20 mL

## 2018-07-24 MED ORDER — DEXAMETHASONE SODIUM PHOSPHATE 10 MG/ML IJ SOLN
INTRAMUSCULAR | Status: DC | PRN
  Administered 2018-07-24: 10 mg via INTRAVENOUS

## 2018-07-24 MED ORDER — ONDANSETRON HCL 4 MG/2ML IJ SOLN
INTRAMUSCULAR | Status: AC
  Filled 2018-07-24: qty 2

## 2018-07-24 MED ORDER — DIPHENHYDRAMINE HCL 12.5 MG/5ML PO ELIX: 12.5000 mg | ORAL_SOLUTION | Freq: Four times a day (QID) | ORAL | Status: DC | PRN

## 2018-07-24 MED ORDER — DEXTROSE-NACL 5-0.45 % IV SOLN
INTRAVENOUS | Status: DC
  Administered 2018-07-24 – 2018-07-28 (×5): via INTRAVENOUS

## 2018-07-24 MED ORDER — ROCURONIUM BROMIDE 100 MG/10ML IV SOLN
INTRAVENOUS | Status: AC
  Filled 2018-07-24: qty 1

## 2018-07-24 MED ORDER — LACTATED RINGERS IV SOLN
INTRAVENOUS | Status: DC | PRN
  Administered 2018-07-24: 09:00:00 via INTRAVENOUS

## 2018-07-24 MED ORDER — ACETAMINOPHEN 500 MG PO TABS: 1000.0000 mg | ORAL_TABLET | Freq: Four times a day (QID) | ORAL | Status: DC

## 2018-07-24 MED ORDER — OXYCODONE HCL 5 MG PO TABS
5.0000 mg | ORAL_TABLET | ORAL | Status: DC | PRN
  Administered 2018-07-25 – 2018-07-29 (×6): 5 mg via ORAL
  Filled 2018-07-24 (×7): qty 1

## 2018-07-24 MED ORDER — WATER FOR IRRIGATION, STERILE IR SOLN
Status: DC | PRN
  Administered 2018-07-24: 1000 mL

## 2018-07-24 MED ORDER — STERILE WATER FOR IRRIGATION IR SOLN
Status: DC | PRN
  Administered 2018-07-24: 1000 mL

## 2018-07-24 MED ORDER — ONDANSETRON HCL 4 MG/2ML IJ SOLN
INTRAMUSCULAR | Status: DC | PRN
  Administered 2018-07-24 (×2): 4 mg via INTRAVENOUS

## 2018-07-24 MED ORDER — ONDANSETRON HCL 4 MG/2ML IJ SOLN
4.0000 mg | Freq: Once | INTRAMUSCULAR | Status: AC | PRN
  Administered 2018-07-24: 4 mg via INTRAVENOUS

## 2018-07-24 MED ORDER — TRIAMTERENE-HCTZ 37.5-25 MG PO TABS
1.0000 | ORAL_TABLET | Freq: Every day | ORAL | Status: DC
  Administered 2018-07-25 – 2018-07-30 (×6): 1 via ORAL
  Filled 2018-07-24 (×7): qty 1

## 2018-07-24 MED ORDER — LIDOCAINE HCL (CARDIAC) PF 100 MG/5ML IV SOSY
PREFILLED_SYRINGE | INTRAVENOUS | Status: AC
  Filled 2018-07-24: qty 5

## 2018-07-24 MED ORDER — SODIUM CHLORIDE 0.9 % IJ SOLN
INTRAMUSCULAR | Status: AC
  Filled 2018-07-24: qty 20

## 2018-07-24 MED ORDER — FENTANYL CITRATE (PF) 250 MCG/5ML IJ SOLN
INTRAMUSCULAR | Status: AC
  Filled 2018-07-24: qty 5

## 2018-07-24 MED ORDER — ACETAMINOPHEN 10 MG/ML IV SOLN
1000.0000 mg | Freq: Four times a day (QID) | INTRAVENOUS | Status: AC
  Administered 2018-07-24 – 2018-07-25 (×3): 1000 mg via INTRAVENOUS
  Filled 2018-07-24 (×3): qty 100

## 2018-07-24 MED ORDER — FENTANYL CITRATE (PF) 100 MCG/2ML IJ SOLN
INTRAMUSCULAR | Status: AC
  Filled 2018-07-24: qty 2

## 2018-07-24 MED ORDER — LACTATED RINGERS IR SOLN
Status: DC | PRN
  Administered 2018-07-24: 1000 mL

## 2018-07-24 MED ORDER — ROCURONIUM BROMIDE 10 MG/ML (PF) SYRINGE
PREFILLED_SYRINGE | INTRAVENOUS | Status: DC | PRN
  Administered 2018-07-24 (×2): 40 mg via INTRAVENOUS
  Administered 2018-07-24: 20 mg via INTRAVENOUS

## 2018-07-24 MED ORDER — FENTANYL CITRATE (PF) 250 MCG/5ML IJ SOLN
INTRAMUSCULAR | Status: DC | PRN
  Administered 2018-07-24: 100 ug via INTRAVENOUS
  Administered 2018-07-24 (×5): 50 ug via INTRAVENOUS

## 2018-07-24 MED ORDER — LACTATED RINGERS IV SOLN
INTRAVENOUS | Status: DC
  Administered 2018-07-24 (×2): via INTRAVENOUS

## 2018-07-24 MED ORDER — PHENYLEPHRINE HCL 10 MG/ML IJ SOLN
INTRAMUSCULAR | Status: AC
  Filled 2018-07-24: qty 2

## 2018-07-24 MED ORDER — ALBUMIN HUMAN 5 % IV SOLN
INTRAVENOUS | Status: DC | PRN
  Administered 2018-07-24: 13:00:00 via INTRAVENOUS

## 2018-07-24 MED ORDER — LABETALOL HCL 5 MG/ML IV SOLN
INTRAVENOUS | Status: DC | PRN
  Administered 2018-07-24: 5 mg via INTRAVENOUS

## 2018-07-24 MED ORDER — PROPOFOL 10 MG/ML IV BOLUS
INTRAVENOUS | Status: AC
  Filled 2018-07-24: qty 20

## 2018-07-24 MED ORDER — PHENYLEPHRINE 40 MCG/ML (10ML) SYRINGE FOR IV PUSH (FOR BLOOD PRESSURE SUPPORT)
PREFILLED_SYRINGE | INTRAVENOUS | Status: DC | PRN
  Administered 2018-07-24: 80 ug via INTRAVENOUS

## 2018-07-24 MED ORDER — ALBUMIN HUMAN 5 % IV SOLN
INTRAVENOUS | Status: AC
  Filled 2018-07-24: qty 250

## 2018-07-24 MED ORDER — DEXAMETHASONE SODIUM PHOSPHATE 10 MG/ML IJ SOLN
INTRAMUSCULAR | Status: AC
  Filled 2018-07-24: qty 1

## 2018-07-24 MED ORDER — BUPIVACAINE LIPOSOME 1.3 % IJ SUSP
20.0000 mL | Freq: Once | INTRAMUSCULAR | Status: AC
  Administered 2018-07-24: 20 mL
  Filled 2018-07-24: qty 20

## 2018-07-24 MED ORDER — PROMETHAZINE HCL 25 MG/ML IJ SOLN
INTRAMUSCULAR | Status: AC
  Filled 2018-07-24: qty 1

## 2018-07-24 MED ORDER — SODIUM CHLORIDE 0.9 % IV SOLN
INTRAVENOUS | Status: DC | PRN
  Administered 2018-07-24: 20 ug/min via INTRAVENOUS

## 2018-07-24 MED ORDER — PROPOFOL 10 MG/ML IV BOLUS
INTRAVENOUS | Status: DC | PRN
  Administered 2018-07-24: 80 mg via INTRAVENOUS

## 2018-07-24 MED ORDER — SUGAMMADEX SODIUM 200 MG/2ML IV SOLN
INTRAVENOUS | Status: DC | PRN
  Administered 2018-07-24: 200 mg via INTRAVENOUS

## 2018-07-24 MED ORDER — LABETALOL HCL 5 MG/ML IV SOLN
INTRAVENOUS | Status: AC
  Filled 2018-07-24: qty 4

## 2018-07-24 MED ORDER — LIDOCAINE 2% (20 MG/ML) 5 ML SYRINGE
INTRAMUSCULAR | Status: DC | PRN
  Administered 2018-07-24: 40 mg via INTRAVENOUS

## 2018-07-24 SURGICAL SUPPLY — 102 items
ADH SKN CLS APL DERMABOND .7 (GAUZE/BANDAGES/DRESSINGS) ×4
AGENT HMST KT MTR STRL THRMB (HEMOSTASIS)
APL ESCP 34 STRL LF DISP (HEMOSTASIS)
APL SWBSTK 6 STRL LF DISP (MISCELLANEOUS) ×2
APPLICATOR COTTON TIP 6 STRL (MISCELLANEOUS) ×2 IMPLANT
APPLICATOR COTTON TIP 6IN STRL (MISCELLANEOUS) ×4
APPLICATOR SURGIFLO ENDO (HEMOSTASIS) IMPLANT
BAG LAPAROSCOPIC 12 15 PORT 16 (BASKET) ×2 IMPLANT
BAG RETRIEVAL 12/15 (BASKET) ×3
BAG RETRIEVAL 12/15MM (BASKET) ×1
BAG URO CATCHER STRL LF (MISCELLANEOUS) ×2 IMPLANT
BASKET ZERO TIP NITINOL 2.4FR (BASKET) IMPLANT
BLADE SURG SZ10 CARB STEEL (BLADE) IMPLANT
BSKT STON RTRVL ZERO TP 2.4FR (BASKET)
CATH FOLEY 2WAY SLVR 18FR 30CC (CATHETERS) ×4 IMPLANT
CATH INTERMIT  6FR 70CM (CATHETERS) IMPLANT
CELLS DAT CNTRL 66122 CELL SVR (MISCELLANEOUS) ×2 IMPLANT
CHLORAPREP W/TINT 26ML (MISCELLANEOUS) ×4 IMPLANT
CLIP VESOLOCK LG 6/CT PURPLE (CLIP) ×8 IMPLANT
CLIP VESOLOCK MED LG 6/CT (CLIP) ×4 IMPLANT
CLIP VESOLOCK XL 6/CT (CLIP) ×6 IMPLANT
CLOTH BEACON ORANGE TIMEOUT ST (SAFETY) ×2 IMPLANT
COVER SURGICAL LIGHT HANDLE (MISCELLANEOUS) ×4 IMPLANT
COVER TIP SHEARS 8 DVNC (MISCELLANEOUS) ×2 IMPLANT
COVER TIP SHEARS 8MM DA VINCI (MISCELLANEOUS) ×2
COVER WAND RF STERILE (DRAPES) ×2 IMPLANT
DECANTER SPIKE VIAL GLASS SM (MISCELLANEOUS) ×4 IMPLANT
DERMABOND ADVANCED (GAUZE/BANDAGES/DRESSINGS) ×4
DERMABOND ADVANCED .7 DNX12 (GAUZE/BANDAGES/DRESSINGS) ×4 IMPLANT
DRAIN PENROSE 18X1/2 LTX STRL (DRAIN) IMPLANT
DRAPE ARM DVNC X/XI (DISPOSABLE) ×8 IMPLANT
DRAPE COLUMN DVNC XI (DISPOSABLE) ×2 IMPLANT
DRAPE DA VINCI XI ARM (DISPOSABLE) ×8
DRAPE DA VINCI XI COLUMN (DISPOSABLE) ×2
DRAPE SURG IRRIG POUCH 19X23 (DRAPES) ×2 IMPLANT
DRSG TEGADERM 4X4.75 (GAUZE/BANDAGES/DRESSINGS) ×2 IMPLANT
ELECT CAUTERY BLADE 6.4 (BLADE) ×2 IMPLANT
ELECT REM PT RETURN 15FT ADLT (MISCELLANEOUS) ×4 IMPLANT
GLOVE BIO SURGEON STRL SZ 6.5 (GLOVE) ×6 IMPLANT
GLOVE BIO SURGEONS STRL SZ 6.5 (GLOVE) ×2
GLOVE BIOGEL M STRL SZ7.5 (GLOVE) ×14 IMPLANT
GOWN STRL REUS W/TWL LRG LVL3 (GOWN DISPOSABLE) ×22 IMPLANT
GUIDEWIRE ANG ZIPWIRE 038X150 (WIRE) ×2 IMPLANT
GUIDEWIRE STR DUAL SENSOR (WIRE) IMPLANT
IRRIG SUCT STRYKERFLOW 2 WTIP (MISCELLANEOUS) ×4
IRRIGATION SUCT STRKRFLW 2 WTP (MISCELLANEOUS) ×2 IMPLANT
KIT PROCEDURE DA VINCI SI (MISCELLANEOUS) ×2
KIT PROCEDURE DVNC SI (MISCELLANEOUS) IMPLANT
LOOP VESSEL MAXI BLUE (MISCELLANEOUS) ×4 IMPLANT
MANIFOLD NEPTUNE II (INSTRUMENTS) ×4 IMPLANT
NDL INSUFFLATION 14GA 120MM (NEEDLE) ×2 IMPLANT
NEEDLE INSUFFLATION 14GA 120MM (NEEDLE) ×4 IMPLANT
PACK CYSTO (CUSTOM PROCEDURE TRAY) ×4 IMPLANT
PACK ROBOT UROLOGY CUSTOM (CUSTOM PROCEDURE TRAY) ×4 IMPLANT
PAD POSITIONING PINK XL (MISCELLANEOUS) ×4 IMPLANT
PORT ACCESS TROCAR AIRSEAL 12 (TROCAR) ×2 IMPLANT
PORT ACCESS TROCAR AIRSEAL 5M (TROCAR) ×2
RELOAD STAPLE 60 2.6 WHT THN (STAPLE) ×6 IMPLANT
RELOAD STAPLE 60 4.1 GRN THCK (STAPLE) ×6 IMPLANT
RELOAD STAPLER GREEN 60MM (STAPLE) ×6 IMPLANT
RELOAD STAPLER WHITE 60MM (STAPLE) ×16 IMPLANT
RETRACTOR LONRSTAR 16.6X16.6CM (MISCELLANEOUS) IMPLANT
RETRACTOR STAY HOOK 5MM (MISCELLANEOUS) IMPLANT
RETRACTOR STER APS 16.6X16.6CM (MISCELLANEOUS)
RETRACTOR WND ALEXIS 18 MED (MISCELLANEOUS) ×2 IMPLANT
RTRCTR WOUND ALEXIS 18CM MED (MISCELLANEOUS) ×4
SEAL CANN UNIV 5-8 DVNC XI (MISCELLANEOUS) ×8 IMPLANT
SEAL XI 5MM-8MM UNIVERSAL (MISCELLANEOUS) ×8
SET TRI-LUMEN FLTR TB AIRSEAL (TUBING) ×4 IMPLANT
SLEEVE SURGEON STRL (DRAPES) ×2 IMPLANT
SOLUTION ELECTROLUBE (MISCELLANEOUS) ×4 IMPLANT
SPONGE LAP 18X18 RF (DISPOSABLE) ×8 IMPLANT
SPONGE LAP 4X18 RFD (DISPOSABLE) ×4 IMPLANT
STAPLER ECHELON LONG 60 440 (INSTRUMENTS) ×4 IMPLANT
STAPLER RELOAD GREEN 60MM (STAPLE) ×12
STAPLER RELOAD WHITE 60MM (STAPLE) ×32
STENT SET URETHERAL LEFT 7FR (STENTS) ×4 IMPLANT
STENT SET URETHERAL RIGHT 7FR (STENTS) ×4 IMPLANT
SURGIFLO W/THROMBIN 8M KIT (HEMOSTASIS) IMPLANT
SUT CHROMIC 4 0 RB 1X27 (SUTURE) ×4 IMPLANT
SUT ETHILON 3 0 PS 1 (SUTURE) ×4 IMPLANT
SUT MNCRL AB 4-0 PS2 18 (SUTURE) ×8 IMPLANT
SUT PDS AB 0 CTX 36 PDP370T (SUTURE) ×12 IMPLANT
SUT SILK 3 0 SH 30 (SUTURE) IMPLANT
SUT SILK 3 0 SH CR/8 (SUTURE) ×4 IMPLANT
SUT VIC AB 2-0 SH 18 (SUTURE) IMPLANT
SUT VIC AB 2-0 UR5 27 (SUTURE) ×16 IMPLANT
SUT VIC AB 3-0 SH 27 (SUTURE) ×24
SUT VIC AB 3-0 SH 27X BRD (SUTURE) ×2 IMPLANT
SUT VIC AB 3-0 SH 27XBRD (SUTURE) ×2 IMPLANT
SUT VIC AB 4-0 RB1 27 (SUTURE) ×16
SUT VIC AB 4-0 RB1 27XBRD (SUTURE) ×8 IMPLANT
SUT VICRYL 0 UR6 27IN ABS (SUTURE) ×2 IMPLANT
SUT VLOC BARB 180 ABS3/0GR12 (SUTURE) ×4
SUTURE VLOC BRB 180 ABS3/0GR12 (SUTURE) ×2 IMPLANT
SYSTEM UROSTOMY GENTLE TOUCH (WOUND CARE) ×4 IMPLANT
TOWEL OR NON WOVEN STRL DISP B (DISPOSABLE) ×4 IMPLANT
TROCAR BLADELESS 15MM (ENDOMECHANICALS) ×4 IMPLANT
TUBING CONNECTING 10 (TUBING) ×2 IMPLANT
TUBING CONNECTING 10' (TUBING)
WATER STERILE IRR 1000ML POUR (IV SOLUTION) ×4 IMPLANT
YANKAUER SUCT BULB TIP 10FT TU (MISCELLANEOUS) ×2 IMPLANT

## 2018-07-24 NOTE — Anesthesia Procedure Notes (Signed)
Procedure Name: Intubation Date/Time: 07/24/2018 8:44 AM Performed by: Mitzie Na, CRNA Pre-anesthesia Checklist: Patient identified, Emergency Drugs available, Suction available, Patient being monitored and Timeout performed Patient Re-evaluated:Patient Re-evaluated prior to induction Oxygen Delivery Method: Circle system utilized Preoxygenation: Pre-oxygenation with 100% oxygen Induction Type: IV induction Ventilation: Mask ventilation without difficulty Laryngoscope Size: Mac and 3 Grade View: Grade I Tube type: Oral Tube size: 7.0 mm Number of attempts: 1 Airway Equipment and Method: Stylet Placement Confirmation: ETT inserted through vocal cords under direct vision,  positive ETCO2 and breath sounds checked- equal and bilateral Secured at: 22 cm Tube secured with: Tape Dental Injury: Teeth and Oropharynx as per pre-operative assessment

## 2018-07-24 NOTE — Discharge Instructions (Signed)

## 2018-07-24 NOTE — Progress Notes (Signed)
Upon arrival to my shift, this patient was being transferred to surgery therefore this RN was unable to assess the patient due to being off unit. After surgery this patient may go to stepdown per leaving RN.

## 2018-07-24 NOTE — Op Note (Signed)
NAME: Colleen Hughes, Colleen Hughes MEDICAL RECORD [id number] ACCOUNT [id number] DATE OF BIRTH:[birth year] FACILITY: [facility] LOCATION: [facility] PHYSICIAN:Donnavan Covault, MD  OPERATIVE REPORT  DATE OF PROCEDURE:  07/24/2018  PREOPERATIVE DIAGNOSIS:  Large volume muscle invasive bladder cancer, suspect stage III or early IV.    POSTOPERATIVE DIAGNOSIS:  PREOPERATIVE DIAGNOSIS:  Large volume muscle invasive bladder cancer, suspect stage III or early IV.    PROCEDURE: 1.  Cystoscopy with an indocyanine green dye injection. 2.  Robotic-assisted laparoscopic radical cystectomy with bilateral oophorectomy and pelvic lymph node dissection. 3.  Ileal conduit urinary diversion.  ESTIMATED BLOOD LOSS:  100 mL.  COMPLICATIONS:  None.  ASSISTANT:  Bari Mantis, PA-C  FINDINGS: 1.  Nodular necrotic right lateral bladder mass on cystoscopy. 2.  Multiple sentinel lymph nodes within all pelvic lymph node field.  These were denoted as such on pathology requisition. 3.  Large right malignant hydronephrosis. 4.  Suspect stage IV disease with some early right-sided pelvic sidewall invasion.  SPECIMENS: 1.  Right distal ureteral margin. 2.  Left distal ureteral margin. 3.  Final right ureteral margin. 4.  Final left ureteral margin. 5.  Right external iliac lymph nodes. 6.  Right obturator lymph nodes. 7.  Right common iliac lymph nodes. 8.  Left common iliac lymph nodes. 9.  Left external iliac lymph nodes. 10.  Left obturator lymph nodes. 11.  Bladder plus urethra plus anterior vagina plus bilateral ovaries en block.  INDICATIONS:  The patient is a very vigorous 82 year old lady who was found on workup of gross hematuria to have a large volume bladder cancer.  She has malignant hydronephrosis on the right.  There is some concern based on imaging for a stage III or  greater disease, but not obviously metastatic beyond the pelvis.  Options were discussed for management including purely palliative intent  protocols versus curative intent surgical extirpation with urinary diversion versus ablative therapy with  radiation.  She adamantly wished to proceed with curative intent surgery.  She was admitted to the hospital yesterday for bowel prep, stomal marking and labs.  Informed consent was obtained and placed in medical record.  DESCRIPTION OF PROCEDURE:  The patient being Colleen Hughes, procedure being robotic cystectomy with no dissection, injection of an indocyanine green dye and urinary diversion was confirmed.  Procedure timeout was performed.  Intravenous access  administered.  General endotracheal anesthesia induced.  The patient was placed into a low lithotomy position and sterile field was created prepping and draping the patient's vagina, introitus and proximal thighs using iodine.  Cystourethroscopy was  performed using injection cystoscope set.  Inspection of bladder revealed some nodular and necrotic changes of the right bladder especially in the trigone area consistent with known large tumor.  Next,  3 mL of an indocyanine green dye was injected at the periphery of the tumor creating submucosal blebs in the area.  Foley catheter was then placed per urethra straight drain.  During this time, arterial line was placed by the anesthesia staff.  The  patient was then repositioned with her arms tucked at her side with gel rolls taking exquisite care to pad all bony prominences given her very thin body habitus and a new sterile field was created by prepping and draping the patient's vagina, introitus  and proximal thighs using iodine and her infra-xiphoid abdomen using chlorhexidine gluconate.  After that, she was further fastened to the table using 3-inch tape with foam padding across the supraxiphoid chest.  A test while she was in steep  Trendelenburg position and found to be suitably positioned.  Next a high-flow, low-pressure pneumoperitoneum was obtained with Veress technique in the infraumbilical  midline having passed the aspiration and drop test.  An 8 mm robotic camera port was  then placed in the same location.  Laparoscopic examination noted no significant adhesions, no visceral injury.  Additional ports were placed as follows:  Right paramedian 8 mm robotic port approximately 2 fingerbreadths inferior lateral to the stoma  site, right paramedian 15 mm assist port at the site of the prior stomal site, right far lateral 12 mm port, left paramedian 8 mm robotic port, left far lateral 8 mm robotic port.  Robot was docked and passed electronic checks.  Initial attention was  directed to identification of the left ureter.  Incision was made lateral to the descending colon and the posterior peritoneum from the area of the iliac vessels superiorly for a distance of 6 cm and inferiorly in the left ureter was encountered as it  coursed over the gonadal vessels marked a vessel loop, dissected proximally for a distance of approximately 6 cm above the iliac vessels and then distal to the ureterovesical junction, which was doubly clipped and ligated with the proximal end being a  tagged dyed suture.  Frozen section was negative for carcinoma.  The ureter was placed out of the true pelvis.  This exposed the pelvic lymph node fields on the left.  Sentinel lymph angiography revealed excellent uptake of dye within the bladder and coursing throughout lymphatic channels towards the pelvic lymph node fields are multiple pelvic lymph nodes, sentinel.  These were denoted as such on the pathology  requisition.  First, the left external group was dissected free with the boundaries being left external iliac artery, vein, pelvic sidewall, iliac bifurcation.  Lymphostasis achieved with cold clips.  Left common group was dissected free with the  boundaries being the aortic bifurcation and iliac bifurcation.  Lymphostasis again achieved with cold clips left obturator group was dissected free with the boundaries being left  external iliac vein, pelvic sidewall, obturator nerve.  Lymphostasis was  achieved with cold clips.  Left obturator nerve was inspected following the nerves and found to be uninjured.  These packets inherently given her thin body habitus also contained the left internal iliac groups.  Similarly, attention was directed to  identification of the right ureter into the area made in the right posterior peritoneum lateral to the ascending colon from the area of the cecum proximally and then distally.  The right ureter was encountered as it coursed over the iliac vessels.  It  was a very large with no hydronephrosis as expected.  A circumferentially mobilized marked a vessel loop, dissected distally to the ureterovesical junction, which was doubly clipped and ligated.  Frozen section negative for carcinoma proximal and  containing undyed tag suture and then dissected proximally for a distance of approximately 4 cm above the area of the iliac crossing.  The right ureter was transposed of the true pelvis.  Lymphadenectomy was then performed on the right side as per the  left side.  The right obturator nerve was inspected and found to be uninjured.  Attention was directed at retroperitoneal disease of the left ureter.  Via a left-sided posterior peritoneal approach, the aortic bifurcation was identified.  Dissection  proceeded just anterior to this posterior to the posterior peritoneum, colonic vessels, colon, the left ureter was encountered and brought to this posterior peritoneal window to the right side, again out  of the true pelvis.  Next vaginal cuff was  identified and placing a sponge stick in the vagina and this was purposely dissected onto and a posterior flap was generated keeping approximately 50% circumference of the vagina anteriorly with the bladder specimen.  This exposed the vascular pedicles  bilaterally including the ovaries very much.  The ovarian pedicles were controlled using Hem-o-Lok clips.  The  right bladder pedicle was fully controlled using vascular stapler, taking exquisite care to avoid any injury to the rectum or vaginal flap,  which did not occur.  Lateral bladder dissection was performed on the right side.  There was significant desmoplastic reaction around the lateral bladder wall on the right.  This is highly concerning for locally advanced disease, possibly early stage IV.   As such, the plane of dissection was purposely chosen within the pelvic side wall musculature in an effort to achieve a negative margin on the right side.  Similarly, left bladder pedicles were controlled and the left lateral bladder wall was swept  away from the pelvic sidewall.  Anterior attachments were taken down developing the space of Retzius and the dorsal vein of the clitoris was controlled using bipolar energy.  Using a combination of anterior dissection and posterior dissection visualizing  the prior vaginal flap, the plane was chosen which completely and circumferentially excised the urethral meatus.  The Foley catheter was then proximally clipped with extra-large Hem-o-Lok and purposely ligated.  This completely freed up the bladder plus  bilateral ovaries plus anterior vaginal wall.  Specimen en bloc was placed in EndoCatch bag for later retrieval.  Hemostasis appeared excellent.  Sponge, needle counts were correct.  Attention was directed to the vaginal cuff closure.  A double armed  2-0 V-Loc suture was used to clamshell the vaginal cuff, which resulted in excellent mucosal apposition.    A vaginal exam followed by rectal exam was performed under laparoscopic vision with indicator glove, no evidence of vaginal or rectal violation was noted.  Attention was directed to identification of the terminal ileum.  Terminal ileum was seen as it  coursed into the area of the cecum at the ileocecal junction was traced proximally a distance of 15 cm and then the epiploic fat of this marked using a tagged silk  suture.  The right ureter, left ureter, terminal ileum tags sutures were then grasped with  a self-locking laparoscopic grasper via the 15 mm assistant port site.  Closed suction drain was brought the previous left lateral most port site in the peritoneal cavity.  The specimen string was brought to the left paramedian robotic port site.  Robot  was then undocked.  Specimen was retrieved by extending the previous camera port site inferiorly for a distance of approximately 5 cm, removing the specimen and setting aside for pathology.  The tagged ureters and bowel within were then brought to the  extraction site after a wound protector was then applied.  The ureters appeared to have suitable vascularity and length as did the bowel.  Attention was directed at the bowel harvest.  A segment of terminal ileum approximately 10 cm in length was taken  into continuity.  The distal end corresponded to the previously marked tag.  The mesentery was developed using a white load stapler x1 proximally and distally, taking exquisite care to avoid devascularization of the conduit or anastomotic segments.  The  conduit was placed into a retroperitoneal orientation and side-to-side bowel anastomosis was performed using 2 green load stapler fires with  the free end oversewn using running silk and a separate imbricating layer of running silk.  The acute angle  anastomosis was bolstered using interrupted silk.  The mesenteric defect was closed using interrupted silk.  The bowel anastomosis was visibly viable and palpably patent and returned to the peritoneal cavity.  Attention was directed at the ureteroenteric  anastomosis.  The proximal end of the conduit was excluded at the level of the staple line using a running Vicryl.  Attention was directed at the anastomosis of the left ureter, which was placed more proximally on the conduit.  A segment of proximal  bowel serosa mucosa was excised approximately 4 mm and discarded and 4  mucosal everting sutures of Vicryl were placed. The left distal ureter was spatulated with a final margin was set aside from pathology.  A heel stitch was applied and a blue colored  bander 2 cm to the anastomosis was applied.  The ureteroenteric anastomosis was performed using 2 separate suture lines of running Vicryl from the heel to toe outwards which resulted in excellent tension-free apposition.  A 4-0 chromic suture was used  through and through the blue colored bander stent to avoid early removal.  Attention was directed to the right ureteroenteric anastomosis.  The right ureter, given the hydronephrosis was of very large caliber.  It had been frozen section negative.  It  was reexcised without having to be spatulated with additional final margin set aside as per the left ureter which was anastomosed to the bowel, first using mucosal everting sutures and 2 separate running suture lines of Vicryl after a heel stitch, but a  red colored bander stent placed a 20 cm to the anastomosis which was also anchored using interrupted chromic suture.  The previously marked stomal site was then developed to accommodate 2 surgeon's fingers and four 0 Vicryl tag sutures were applied were  applied to the corners of this in the distal conduit and stents were brought through this for a distance of approximately 3 cm above the level of the skin line and the 4 fascial anchors were applied to the level of the conduit bowel to prevent parastomal  hernia.  The abdomen was once again inspected via the extraction site.  Hemostasis appeared excellent.  There was no evidence of bowel injury.  The extraction site was closed with fascia using figure-of-eight PDS x5 followed by reapproximation of  Scarpa's with a running Vicryl.  All incision sites were infiltrated with dilute likewise Marcaine and closed with skin using subcuticular Monocryl by Dermabond.  Final rosebudding maturation of the stoma was performed using 2-0 Vicryl  x4 with intervening 3-0 Vicryl to reapproximate the bowel mucosa to the skin.  An ostomy appliance was applied.  Procedure terminated.    The patient tolerated the procedure well.  No immediate apparent complications.  The patient was taken to postanesthesia care in stable condition with plan for a step down admission.  Please note, first assistant Bari Mantis, PA was crucial for all portions of the surgery today.  She provided invaluable retractions, specimen manipulation, vascular clipping, vascular stapling, bowel manipulation.  AN/NUANCE  D:07/24/2018 T:07/24/2018 JOB:[id number]

## 2018-07-24 NOTE — Transfer of Care (Signed)
Immediate Anesthesia Transfer of Care Note  Patient: Colleen Hughes  Procedure(s) Performed: XI ROBOTIC ASSISTED LAPAROSCOPIC COMPLETE CYSTECT ILEAL CONDUIT (N/A ) LYMPHADENECTOMY (Bilateral ) CYSTOSCOPY WITH INDOCYANINE GREEN DYE (N/A )  Patient Location: PACU  Anesthesia Type:General  Level of Consciousness: awake, alert , oriented and patient cooperative  Airway & Oxygen Therapy: Patient Spontanous Breathing and Patient connected to face mask oxygen  Post-op Assessment: Report given to RN, Post -op Vital signs reviewed and stable and Patient moving all extremities  Post vital signs: Reviewed and stable  Last Vitals:  Vitals Value Taken Time  BP 124/56 07/24/2018  2:15 PM  Temp    Pulse 82 07/24/2018  2:21 PM  Resp 12 07/24/2018  2:21 PM  SpO2 100 % 07/24/2018  2:21 PM  Vitals shown include unvalidated device data.  Last Pain:  Vitals:   07/24/18 0728  TempSrc:   PainSc: 0-No pain         Complications: No apparent anesthesia complications

## 2018-07-24 NOTE — Brief Op Note (Signed)
07/23/2018 - 07/24/2018  1:49 PM  PATIENT:  Colleen Hughes  82 y.o. female  PRE-OPERATIVE DIAGNOSIS:  BLADDER CANCER  POST-OPERATIVE DIAGNOSIS:  BLADDER CANCER  PROCEDURE:  Procedure(s): XI ROBOTIC ASSISTED LAPAROSCOPIC COMPLETE CYSTECT ILEAL CONDUIT (N/A) LYMPHADENECTOMY (Bilateral) CYSTOSCOPY WITH INDOCYANINE GREEN DYE (N/A)  SURGEON:  Surgeon(s) and Role:    Alexis Frock, MD - Primary  PHYSICIAN ASSISTANT:   ASSISTANTS: Clemetine Marker PA   ANESTHESIA:   general  EBL:  125 mL   BLOOD ADMINISTERED:none  DRAINS: 1 - JP to bulb, 2 - urostomy to gravity wtih Rt (red) and Lt (blue) bander stents   LOCAL MEDICATIONS USED:  MARCAINE     SPECIMEN:  Source of Specimen:  1 - pelvic lymph nodes, 2- bilateral ureteral margins, 3 - bladder + anterior vagina + bilateral ovaries  DISPOSITION OF SPECIMEN:  PATHOLOGY  COUNTS:  YES  TOURNIQUET:  * No tourniquets in log *  DICTATION: .Other Dictation: Dictation Number [id number]  PLAN OF CARE: Admit to inpatient   PATIENT DISPOSITION:  PACU - hemodynamically stable.   Delay start of Pharmacological VTE agent (>24hrs) due to surgical blood loss or risk of bleeding: yes

## 2018-07-24 NOTE — Anesthesia Postprocedure Evaluation (Signed)
Anesthesia Post Note  Patient: Colleen Hughes  Procedure(s) Performed: XI ROBOTIC ASSISTED LAPAROSCOPIC COMPLETE CYSTECT ILEAL CONDUIT (N/A ) LYMPHADENECTOMY (Bilateral ) CYSTOSCOPY WITH INDOCYANINE GREEN DYE (N/A )     Patient location during evaluation: PACU Anesthesia Type: General Level of consciousness: awake and alert, awake and oriented Pain management: pain level controlled Vital Signs Assessment: post-procedure vital signs reviewed and stable Respiratory status: spontaneous breathing, nonlabored ventilation and respiratory function stable Cardiovascular status: blood pressure returned to baseline and stable Postop Assessment: no apparent nausea or vomiting Anesthetic complications: no    Last Vitals:  Vitals:   07/24/18 1600 07/24/18 1605  BP: 126/72   Pulse: 84   Resp: (!) 27   Temp:  (!) 36.3 C  SpO2: 93%     Last Pain:  Vitals:   07/24/18 1605  TempSrc: Oral  PainSc:                  Catalina Gravel

## 2018-07-24 NOTE — Progress Notes (Signed)
Day of Surgery   Subjective/Chief Complaint:   1 - Muscle Invasive Bladder Cancer - Admitted 10/8 for labs, stomal marking, bowel prep.  Today "Colleen Hughes" is ready for cystectomy. She completed bowel prep to clear and received stomal marking.   Objective: Vital signs in last 24 hours: Temp:  [98.3 F (36.8 C)-98.8 F (37.1 C)] 98.8 F (37.1 C) (10/09 0433) Pulse Rate:  [74-98] 74 (10/09 0555) Resp:  [18] 18 (10/09 0433) BP: (93-118)/(50-78) 108/59 (10/09 0555) SpO2:  [98 %-100 %] 98 % (10/09 0433) Weight:  [45 kg] 45 kg (10/08 1045) Last BM Date: 07/23/18  Intake/Output from previous day: 10/08 0701 - 10/09 0700 In: 663.9 [I.V.:663.9] Out: 1150 [Urine:1150] Intake/Output this shift: Total I/O In: 400 [I.V.:400] Out: 500 [Urine:500]  General appearance: alert and cooperative Eyes: negative Nose: Nares normal. Septum midline. Mucosa normal. No drainage or sinus tenderness. Throat: lips, mucosa, and tongue normal; teeth and gums normal Neck: supple, symmetrical, trachea midline and thyroid not enlarged, symmetric, no tenderness/mass/nodules Back: symmetric, no curvature. ROM normal. No CVA tenderness. Resp: non-labored on room air.  Chest wall: no tenderness Cardio: Nl rate GI: soft, non-tender; bowel sounds normal; no masses,  no organomegaly and stomal marking site stable RLQ. Pelvic: external genitalia normal and foley in place with non-foul yellow urine Extremities: extremities normal, atraumatic, no cyanosis or edema Pulses: 2+ and symmetric Neurologic: Grossly normal  Lab Results:  Recent Labs    07/23/18 1151  WBC 20.7*  HGB 10.0*  HCT 32.5*  PLT 477*   BMET Recent Labs    07/23/18 1151  NA 139  K 4.1  CL 102  CO2 25  GLUCOSE 95  BUN 42*  CREATININE 1.49*  CALCIUM 9.4   PT/INR No results for input(s): LABPROT, INR in the last 72 hours. ABG No results for input(s): PHART, HCO3 in the last 72 hours.  Invalid input(s): PCO2,  PO2  Studies/Results: No results found.  Anti-infectives: Anti-infectives (From admission, onward)   Start     Dose/Rate Route Frequency Ordered Stop   07/24/18 0600  piperacillin-tazobactam (ZOSYN) IVPB 3.375 g     3.375 g 100 mL/hr over 30 Minutes Intravenous 30 min pre-op 07/23/18 1131     07/23/18 2000  neomycin (MYCIFRADIN) tablet 500 mg     500 mg Oral Every 4 hours 07/23/18 1823 07/24/18 0359   07/23/18 1830  metroNIDAZOLE (FLAGYL) tablet 500 mg     500 mg Oral Every 4 hours 07/23/18 1823 07/24/18 0229      Assessment/Plan:  Proceed as planned with cysto / ICG, robotic cystectomy / node dissection and ileal conduit diversion. Risks, benefits, alternatives, expected peri-op course discussed previuosly and reiterated today.   [facility], Yennifer Segovia 07/24/2018

## 2018-07-24 NOTE — Anesthesia Procedure Notes (Signed)
Arterial Line Insertion Start/End10/06/2018 8:43 AM, 07/24/2018 8:47 AM Performed by: Catalina Gravel, MD, anesthesiologist  Patient location: Pre-op. Preanesthetic checklist: patient identified, IV checked, site marked, risks and benefits discussed, surgical consent, monitors and equipment checked, pre-op evaluation, timeout performed and anesthesia consent Lidocaine 1% used for infiltration Left, radial was placed Catheter size: 20 Fr Hand hygiene performed  and maximum sterile barriers used   Attempts: 1 Procedure performed without using ultrasound guided technique. Following insertion, dressing applied and Biopatch. Post procedure assessment: normal and unchanged  Patient tolerated the procedure well with no immediate complications.

## 2018-07-25 ENCOUNTER — Encounter (HOSPITAL_COMMUNITY): Payer: Self-pay | Admitting: Urology

## 2018-07-25 LAB — BASIC METABOLIC PANEL
Anion gap: 8 (ref 5–15)
BUN: 23 mg/dL (ref 8–23)
CHLORIDE: 105 mmol/L (ref 98–111)
CO2: 23 mmol/L (ref 22–32)
CREATININE: 1.49 mg/dL — AB (ref 0.44–1.00)
Calcium: 7.8 mg/dL — ABNORMAL LOW (ref 8.9–10.3)
GFR calc Af Amer: 37 mL/min — ABNORMAL LOW (ref >60)
GFR calc non Af Amer: 32 mL/min — ABNORMAL LOW (ref >60)
Glucose, Bld: 164 mg/dL — ABNORMAL HIGH (ref 70–99)
POTASSIUM: 3.9 mmol/L (ref 3.5–5.1)
SODIUM: 136 mmol/L (ref 135–145)

## 2018-07-25 LAB — HEMOGLOBIN AND HEMATOCRIT, BLOOD
HEMATOCRIT: 27.4 % — AB (ref 36.0–46.0)
HEMOGLOBIN: 8.2 g/dL — AB (ref 12.0–15.0)

## 2018-07-25 MED ORDER — SIMETHICONE 80 MG PO CHEW
80.0000 mg | CHEWABLE_TABLET | Freq: Four times a day (QID) | ORAL | Status: DC | PRN
  Administered 2018-07-25 – 2018-07-29 (×5): 80 mg via ORAL
  Filled 2018-07-25 (×5): qty 1

## 2018-07-25 MED ORDER — ACETAMINOPHEN 500 MG PO TABS
1000.0000 mg | ORAL_TABLET | Freq: Three times a day (TID) | ORAL | Status: DC
  Administered 2018-07-25 – 2018-07-30 (×3): 1000 mg via ORAL
  Filled 2018-07-25 (×5): qty 2

## 2018-07-25 NOTE — Progress Notes (Signed)
PT Cancellation Note  Patient Details Name: Colleen Hughes MRN: [id number] DOB: 22-Mar-1936   Cancelled Treatment:    Reason Eval/Treat Not Completed: Pain limiting ability to participate - PT arrived to pt room at approximately 1445 today, and pt with 9/10 abdominal pain, nausea, and wooziness in supine position. Pt refusing mobility at this time. PT unable to check back with pt, will follow up for PT eval tomorrow given improved pain and nausea management.   Julien Girt, PT Acute Rehabilitation Services Pager [phone]  Office [phone]    [place] 07/25/2018, 7:12 PM

## 2018-07-25 NOTE — Consult Note (Addendum)
[facility] Nurse ostomy consult note Stoma type/location: RLQ ileal conduit red and blue stents in place.  Draining amber clear urine.  Transferred to urology unit.  Stomal assessment/size: 7/8" pink and moist Peristomal assessment: not assessed Treatment options for stomal/peristomal skin: 1 piece pouch Output amber urine Ostomy pouching: 1pc. Education provided: Discussed bedside drainage and how to disconnect when out of bed.  Discussed 2-3 times weekly pouch changes.   Will discharge home with daughter in law helping at her.  Scheduled pouch change for Friday at 10AM when she can be present.  Enrolled patient in [place] program: No Written materials provided and at bedside [place] team will follow.  Domenic Moras MSN, RN, FNP-BC CWON Wound, Ostomy, Continence Nurse Pager [phone]

## 2018-07-25 NOTE — Progress Notes (Signed)
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 Path pending. Admitted 10/8 for bowel prep and stomal marking. Observed stepdown POD 0.  2 - Post-Op Ileus - s/p bowel anastamosis as part of surgery 10/9. NPO with ice chips initially  3- Disposition / Rehab - pt completely independent at baseline.   4 - Acute on Chronic Anemia - Hgb 9-10 baseline. S/p cystetomy with some (not large) blood loss 10/9. Has blood avail if transfusion indications arise.   Today "Colleen Hughes" is stable. Pain controlled. Excellent UOP.   Objective: Vital signs in last 24 hours: Temp:  [96.7 F (35.9 C)-98 F (36.7 C)] 96.7 F (35.9 C) (10/10 0400) Pulse Rate:  [65-84] 76 (10/10 0600) Resp:  [9-27] 21 (10/10 0600) BP: (88-126)/(42-77) 106/48 (10/10 0600) SpO2:  [93 %-100 %] 96 % (10/10 0600) Weight:  [45.2 kg] 45.2 kg (10/09 1530) Last BM Date: 07/23/18  Intake/Output from previous day: 10/09 0701 - 10/10 0700 In: 4208.9 [I.V.:3708.9; IV Piggyback:500] Out: 2080 [Urine:1250; Drains:705; Blood:125] Intake/Output this shift: No intake/output data recorded.  General appearance: alert, cooperative and appears stated age Eyes: negative Nose: Nares normal. Septum midline. Mucosa normal. No drainage or sinus tenderness. Throat: lips, mucosa, and tongue normal; teeth and gums normal Neck: supple, symmetrical, trachea midline Back: symmetric, no curvature. ROM normal. No CVA tenderness. Resp: non-labored on minimal  O2 Cardio: HR 90s by bedside monitor.  GI: soft, non-tender; bowel sounds normal; no masses,  no organomegaly Pelvic: external genitalia normal and scant serosanguinal spotting from vaginal cuff Extremities: extremities normal, atraumatic, no cyanosis or edema Pulses: 2+ and symmetric Neurologic: Grossly normal Incision/Wound: RLQ Urostomy pink /  patent with copious non-foul urine and Rt (red) and Lt (blud) distal stents. JP with serosanguinous fluid that is non-foul.   Lab Results:  Recent Labs    07/23/18 1151 07/24/18 1327 07/25/18 0348  WBC 20.7*  --   --   HGB 10.0* 9.2* 8.2*  HCT 32.5* 27.0* 27.4*  PLT 477*  --   --    BMET Recent Labs    07/23/18 1151 07/24/18 1327 07/25/18 0348  NA 139 137 136  K 4.1 3.9 3.9  CL 102  --  105  CO2 25  --  23  GLUCOSE 95 196* 164*  BUN 42*  --  23  CREATININE 1.49*  --  1.49*  CALCIUM 9.4  --  7.8*   PT/INR No results for input(s): LABPROT, INR in the last 72 hours. ABG No results for input(s): PHART, HCO3 in the last 72 hours.  Invalid input(s): PCO2, PO2  Studies/Results: No results found.  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 Path pending. Transfer med-surg floor.   2 - Post-Op Ileus - NPO  with ice chips for now. Likely start clears tomorrow.  3- Disposition / Rehab - PT eval.   4 - Acute on Chronic Anemia - Continue daily H/H for now. Consider transusion if sig hgb drop or new orthostasis. Hold heparin for now.    [facility], Colleen Hughes 07/25/2018

## 2018-07-26 LAB — BASIC METABOLIC PANEL
Anion gap: 5 (ref 5–15)
BUN: 19 mg/dL (ref 8–23)
CO2: 27 mmol/L (ref 22–32)
CREATININE: 1.34 mg/dL — AB (ref 0.44–1.00)
Calcium: 8 mg/dL — ABNORMAL LOW (ref 8.9–10.3)
Chloride: 106 mmol/L (ref 98–111)
GFR, EST AFRICAN AMERICAN: 42 mL/min — AB (ref >60)
GFR, EST NON AFRICAN AMERICAN: 36 mL/min — AB (ref >60)
Glucose, Bld: 110 mg/dL — ABNORMAL HIGH (ref 70–99)
Potassium: 4.2 mmol/L (ref 3.5–5.1)
SODIUM: 138 mmol/L (ref 135–145)

## 2018-07-26 LAB — HEMOGLOBIN AND HEMATOCRIT, BLOOD
HEMATOCRIT: 29.6 % — AB (ref 36.0–46.0)
HEMOGLOBIN: 8.8 g/dL — AB (ref 12.0–15.0)

## 2018-07-26 MED ORDER — PROMETHAZINE HCL 25 MG/ML IJ SOLN
12.5000 mg | Freq: Four times a day (QID) | INTRAMUSCULAR | Status: DC | PRN
  Administered 2018-07-26 – 2018-07-27 (×3): 12.5 mg via INTRAVENOUS
  Filled 2018-07-26 (×3): qty 1

## 2018-07-26 NOTE — Care Management Important Message (Signed)
Important Message  Patient Details  Name: Colleen Hughes MRN: [id number] Date of Birth: [birth year]   Medicare Important Message Given:  Yes    Kerin Salen 07/26/2018, 11:49 AMImportant Message  Patient Details  Name: Colleen Hughes MRN: [id number] Date of Birth: [birth year]   Medicare Important Message Given:  Yes    Kerin Salen 07/26/2018, 11:49 AM

## 2018-07-26 NOTE — Progress Notes (Signed)
Initial Nutrition Assessment  DOCUMENTATION CODES:   Severe malnutrition in context of chronic illness, Underweight  INTERVENTION:   Diet advancement per MD Once diet is advanced past clears, provide Premier Protein BID, each supplement provides 160 kcal and 30 grams of protein.   NUTRITION DIAGNOSIS:   Severe Malnutrition related to chronic illness, cancer and cancer related treatments as evidenced by percent weight loss, energy intake < or equal to 75% for > or equal to 1 month, moderate fat depletion, severe muscle depletion.  GOAL:   Patient will meet greater than or equal to 90% of their needs  MONITOR:   PO intake, Labs, Weight trends, I & O's, Supplement acceptance, Diet advancement  REASON FOR ASSESSMENT:   (Low BMI)    ASSESSMENT:   82 year old pt with bladder cancer. s/p XI ROBOTIC ASSISTED LAPAROSCOPIC COMPLETE CYSTECT ILEAL CONDUIT (N/A), LYMPHADENECTOMY (Bilateral),CYSTOSCOPY  -10/9  Patient in room with family at bedside. Pt reports since her diagnosis with bladder cancer, she has not been eating well and has had poor appetite. Sometimes she tries to eat something and it makes her vomit. Pt denies issue with swallowing. States foods have not tasted right for ~2 months. States she has been trying to supplement her diet with Fairlife chocolate milk (16g protein) and 30g protein shakes. Pt would like to have Premier Protein shakes ordered once her diet is advanced past clears.   Per patient, UBW has been 110-112 lb over the past 3 years. Per weight records, pt has lost 12 lb since 8/19 (11% wt loss x 2 months, significant for time frame).   Medications: D5 -.45% NaCl infusion at 50 ml/hr, IV Zofran PRN Labs reviewed: GFR: 36   NUTRITION - FOCUSED PHYSICAL EXAM:    Most Recent Value  Orbital Region  Mild depletion  Upper Arm Region  Moderate depletion  Thoracic and Lumbar Region  Unable to assess  Buccal Region  Mild depletion  Temple Region  Severe depletion   Clavicle Bone Region  Severe depletion  Clavicle and Acromion Bone Region  Severe depletion  Scapular Bone Region  Severe depletion  Dorsal Hand  Severe depletion  Patellar Region  Unable to assess  Anterior Thigh Region  Unable to assess  Posterior Calf Region  Unable to assess  Edema (RD Assessment)  None       Diet Order:   Diet Order            Diet clear liquid Room service appropriate? Yes; Fluid consistency: Thin  Diet effective now              EDUCATION NEEDS:   Education needs have been addressed  Skin:  Skin Assessment: Reviewed RN Assessment  Last BM:  10/8  Height:   Ht Readings from Last 1 Encounters:  07/24/18 5\' 6"  (1.676 m)    Weight:   Wt Readings from Last 1 Encounters:  07/24/18 45.2 kg    Ideal Body Weight:  59.1 kg  BMI:  Body mass index is 16.08 kg/m.  Estimated Nutritional Needs:   Kcal:  1600-1800  Protein:  70-80g  Fluid:  1.8L/day   Clayton Bibles, MS, RD, LDN [place] Dietitian Pager: [phone] After Hours Pager: [phone]

## 2018-07-26 NOTE — Evaluation (Signed)
Physical Therapy Evaluation Patient Details Name: Colleen Hughes MRN: [id number] DOB: Feb 24, 1936 Today's Date: 07/26/2018   History of Present Illness  82 YO female s/p cystoscopy, robotic assisted laparoscopic radial cystectomy with bilateral oophrectomy and pelvic lymph node dissection, ileal conduit urinary diversion, post-op ileus on 10/9. Pt admitted 10/5 suprapubic pain, distention, history of bladder cancer and has a foley catheter in a place for past 2.5 weeks. Past medical and surgical history includes arthitis, basal cell skin cancer, transurethral resection of bladder tumor.   Clinical Impression   Pt presents with abdominal pain, difficulty performing bed mobility, LE weakness, and decreased tolerance for ambulation. Pt ambulated hallway distance today with min guard assist. Pt very independent and wanted to walk without support, but was reaching for environment so PT administered RW. Pt felt "hot" after session and slightly nauseous, given cool cloth and rest, and symptoms decreased. PT to continue to progress mobility as able and will continue to follow acutely. PT recommending HHPT to follow after hospitalization, will have adequate supervision at home from son and daughter-in-law.     Follow Up Recommendations Home health PT;Supervision for mobility/OOB    Equipment Recommendations  None recommended by PT    Recommendations for Other Services       Precautions / Restrictions Precautions Precautions: Fall Precaution Comments: JP drain, abdominal surgery  Restrictions Weight Bearing Restrictions: No      Mobility  Bed Mobility Overal bed mobility: Needs Assistance Bed Mobility: Sidelying to Sit Rolling: Min assist Sidelying to sit: Min assist       General bed mobility comments: Pt instructed in log roll technique for abdominal protection. Verbal and tactile cuing provided, min assist for completion of rolling, trunk elevation.   Transfers Overall transfer level:  Needs assistance Equipment used: None Transfers: Sit to/from Stand Sit to Stand: Min guard         General transfer comment: Min guard for safety. Pt with some unsteadiness upon standing and reaching for environment for steadying. Use of RW for all ambulation for steadiness.   Ambulation/Gait Ambulation/Gait assistance: Min guard Gait Distance (Feet): 60 Feet Assistive device: Rolling walker (2 wheeled) Gait Pattern/deviations: Decreased stride length;Step-through pattern;Trunk flexed;Narrow base of support Gait velocity: decr    General Gait Details: Min guard for safety, verbal cuing for stepping into RW x2. After ambulation, pt reported feeling hot. Applied cool cloth to pt's forehead, subsided with rest.   Stairs            Wheelchair Mobility    Modified Rankin (Stroke Patients Only)       Balance Overall balance assessment: Needs assistance Sitting-balance support: No upper extremity supported Sitting balance-Leahy Scale: Good     Standing balance support: Bilateral upper extremity supported Standing balance-Leahy Scale: Fair Standing balance comment: unable to accept challenge, unsteadiness without UE support                              Pertinent Vitals/Pain Pain Assessment: 0-10 Pain Score: 8  Pain Location: abdomen, due to incisions and gas  Pain Descriptors / Indicators: Aching;Sore Pain Intervention(s): Limited activity within patient's tolerance;Repositioned;Monitored during session    [place] expects to be discharged to:: Private residence Living Arrangements: Alone Available Help at Discharge: Family;Available 24 hours/day(son and daughter-in-law to stay with the pt, take shifts during the day to stay with her) Type of Home: House Home Access: Ramped entrance     Home Layout:  One level Home Equipment: Startex - 2 wheels;Cane - single point      Prior Function Level of Independence: Independent                Hand Dominance   Dominant Hand: Right    Extremity/Trunk Assessment   Upper Extremity Assessment Upper Extremity Assessment: Overall WFL for tasks assessed    Lower Extremity Assessment Lower Extremity Assessment: RLE deficits/detail;LLE deficits/detail RLE Deficits / Details: MMT screen: hip abduction/adduction 4/5, hip flexion 4/5, knee extension 4+/5, DF/PF 5/5 RLE Sensation: WNL LLE Deficits / Details: MMT screen: hip abduction/adduction 4/5, hip flexion 4/5, knee extension 4+/5, DF/PF 5/5 LLE Sensation: WNL    Cervical / Trunk Assessment Cervical / Trunk Assessment: Normal  Communication   Communication: No difficulties  Cognition Arousal/Alertness: Awake/alert Behavior During Therapy: WFL for tasks assessed/performed Overall Cognitive Status: Within Functional Limits for tasks assessed                                        General Comments      Exercises Other Exercises Other Exercises: PT administered and reviewed LE general strengthening handout. Pt encouraged to avoid exercises that cause tension on abdomen (SLR). PT demonstrated each exercise.    Assessment/Plan    PT Assessment Patient needs continued PT services  PT Problem List Decreased strength;Pain;Decreased range of motion;Decreased activity tolerance;Decreased knowledge of use of DME;Decreased balance;Decreased mobility;Decreased safety awareness       PT Treatment Interventions DME instruction;Therapeutic activities;Gait training;Therapeutic exercise;Patient/family education;Stair training;Balance training;Functional mobility training    PT Goals (Current goals can be found in the Care Plan section)  Acute Rehab PT Goals Patient Stated Goal: go home  PT Goal Formulation: With patient Time For Goal Achievement: 08/09/18 Potential to Achieve Goals: Good    Frequency Min 3X/week   Barriers to discharge        Co-evaluation               AM-PAC PT "6 Clicks"  Daily Activity  Outcome Measure Difficulty turning over in bed (including adjusting bedclothes, sheets and blankets)?: Unable Difficulty moving from lying on back to sitting on the side of the bed? : Unable Difficulty sitting down on and standing up from a chair with arms (e.g., wheelchair, bedside commode, etc,.)?: Unable Help needed moving to and from a bed to chair (including a wheelchair)?: A Little Help needed walking in hospital room?: A Little Help needed climbing 3-5 steps with a railing? : A Little 6 Click Score: 12    End of Session Equipment Utilized During Treatment: Gait belt Activity Tolerance: Patient tolerated treatment well;Patient limited by fatigue Patient left: in chair;with chair alarm set;with family/visitor present;with call bell/phone within reach;with SCD's reapplied Nurse Communication: Mobility status PT Visit Diagnosis: Unsteadiness on feet (R26.81);Difficulty in walking, not elsewhere classified (R26.2)    Time: 8756-4332 PT Time Calculation (min) (ACUTE ONLY): 29 min   Charges:   PT Evaluation $PT Eval Low Complexity: 1 Low PT Treatments $Gait Training: 8-22 mins        Julien Girt, PT Acute Rehabilitation Services Pager [phone]  Office [phone]   Evalene Vath D Elonda Husky 07/26/2018, 2:14 PM

## 2018-07-26 NOTE — Consult Note (Signed)
[facility] Nurse ostomy consult note Stoma type/location: RLQ ileal conduit Stomal assessment/size: 1 "  Peristomal assessment: barrier ring and 1 piece convex urostomy pouch.  Connected to bedside drainage.   Treatment options for stomal/peristomal skin: convexity Output clear amber urine Ostomy pouching: 1pc.convex with barrier ring Education provided: Pouch change performed with daughter in law.  Discussed bedside drainage and she is able to connect and disconnect.  Cleansed peristomal skin and applied barrier ring. Understands rationale for convexity.  Twice weekly pouch changes and to disconnect from bedside drainage when out of bed.  Enrolled patient in [place] program: Yes today [place] team will follow Domenic Moras MSN, RN, FNP-BC CWON Wound, Ostomy, Continence Nurse Pager [phone]

## 2018-07-27 LAB — TYPE AND SCREEN
ABO/RH(D): O NEG
Antibody Screen: POSITIVE
Donor AG Type: NEGATIVE
Donor AG Type: NEGATIVE
PT AG TYPE: NEGATIVE
UNIT DIVISION: 0
UNIT DIVISION: 0

## 2018-07-27 LAB — BPAM RBC
BLOOD PRODUCT EXPIRATION DATE: 201911122359
Blood Product Expiration Date: 201911102359
UNIT TYPE AND RH: 9500
UNIT TYPE AND RH: 9500

## 2018-07-27 LAB — BASIC METABOLIC PANEL
Anion gap: 5 (ref 5–15)
BUN: 14 mg/dL (ref 8–23)
CALCIUM: 8 mg/dL — AB (ref 8.9–10.3)
CHLORIDE: 105 mmol/L (ref 98–111)
CO2: 26 mmol/L (ref 22–32)
CREATININE: 1.15 mg/dL — AB (ref 0.44–1.00)
GFR calc non Af Amer: 44 mL/min — ABNORMAL LOW (ref >60)
GFR, EST AFRICAN AMERICAN: 51 mL/min — AB (ref >60)
Glucose, Bld: 108 mg/dL — ABNORMAL HIGH (ref 70–99)
POTASSIUM: 4 mmol/L (ref 3.5–5.1)
SODIUM: 136 mmol/L (ref 135–145)

## 2018-07-27 LAB — HEMOGLOBIN AND HEMATOCRIT, BLOOD
HCT: 31.8 % — ABNORMAL LOW (ref 36.0–46.0)
Hemoglobin: 9.5 g/dL — ABNORMAL LOW (ref 12.0–15.0)

## 2018-07-27 MED ORDER — ENOXAPARIN SODIUM 30 MG/0.3ML ~~LOC~~ SOLN
30.0000 mg | SUBCUTANEOUS | Status: DC
  Administered 2018-07-27 – 2018-07-30 (×4): 30 mg via SUBCUTANEOUS
  Filled 2018-07-27 (×5): qty 0.3

## 2018-07-27 MED ORDER — ALPRAZOLAM 0.25 MG PO TABS
0.2500 mg | ORAL_TABLET | Freq: Once | ORAL | Status: AC
  Administered 2018-07-27: 0.25 mg via ORAL
  Filled 2018-07-27: qty 1

## 2018-07-27 MED ORDER — KETOROLAC TROMETHAMINE 15 MG/ML IJ SOLN
15.0000 mg | Freq: Four times a day (QID) | INTRAMUSCULAR | Status: DC
  Administered 2018-07-27 (×2): 15 mg via INTRAVENOUS
  Filled 2018-07-27 (×2): qty 1

## 2018-07-27 NOTE — Progress Notes (Signed)
Pt c/o of nausea, dry heaving, pt given 4 mg of IV zofran, cold compresses, no vomit while this RN present. Pt Has not had anything to eat or drink since made NPO. Will continue to monitor.

## 2018-07-27 NOTE — Progress Notes (Signed)
Pt appears restless, anxious, unable to keep legs still. MD Alinda Money paged, per MD will place orders. NAD. Still slightly nauseous pain 8/10 in legs/feet 5/10 abdomen, pt hesitant to take narcotics but agreed to try oxy. Will administer and keep monitoring.

## 2018-07-27 NOTE — Progress Notes (Signed)
Pt c/o of N/V, vomiting clear secretions, Zofran 4 mg IV was administered at 2000 pt has not had anything to eat/drink since, no sudden movements or known precipitating factors. Zofran not due for another hour, Urology paged, verbal order with readback for Phenergan 12.5 mg q 6 hrs as needed for nausea/vomiting, along with Zofran in between doses as needed. MD Borden verbally ordered diet change for pt as of right now to NPO.

## 2018-07-27 NOTE — Progress Notes (Signed)
Patient ID: Colleen Hughes, female   DOB: [birth year], 82 y.o.   MRN: [id number]  3 Days Post-Op Subjective: Pt with severe nausea and vomiting last night and again this morning.  She was made NPO last night.  Passing flatus.  Pain controlled.  Objective: Vital signs in last 24 hours: Temp:  [98.2 F (36.8 C)-98.5 F (36.9 C)] 98.5 F (36.9 C) (10/12 0455) Pulse Rate:  [85-92] 85 (10/12 0455) Resp:  [18] 18 (10/12 0455) BP: (109-118)/(70-90) 118/70 (10/12 0455) SpO2:  [96 %-100 %] 99 % (10/12 0455)  Intake/Output from previous day: 10/11 0701 - 10/12 0700 In: 1264.7 [P.O.:200; I.V.:1064.7] Out: 8325 [Urine:2250; Drains:1015] Intake/Output this shift: No intake/output data recorded.  Physical Exam:  General: Alert and oriented CV: RRR Lungs: Clear Abdomen: Soft, ND, Minimal tenderness GU: Urostomy pink, urine draining well Incisions: C/D/I Ext: NT, No erythema  Lab Results: Recent Labs    07/25/18 0348 07/26/18 0437 07/27/18 0514  HGB 8.2* 8.8* 9.5*  HCT 27.4* 29.6* 31.8*   BMET Recent Labs    07/26/18 0437 07/27/18 0514  NA 138 136  K 4.2 4.0  CL 106 105  CO2 27 26  GLUCOSE 110* 108*  BUN 19 14  CREATININE 1.34* 1.15*  CALCIUM 8.0* 8.0*     Studies/Results: No results found.  Assessment/Plan: POD # 3 s/p RAL cystectomy and ileal conduit - Will allow patient to advance her back to clears if she is feeling better later but cautioned to be slow with her diet - Ambulate, IS - Restart pharmacologic DVT prophylaxis as Hgb is stable - Await return of bowel function   LOS: 4 days   Oland Arquette,LES 07/27/2018, 7:35 AM

## 2018-07-27 NOTE — Progress Notes (Signed)
Dressing around JP drain saturated, new clean dry split gauze placed, drain emptied, site looks clean dry and intact. Used barrier/cream for dry skin and moisturized feet/legs, per pt request, pulses good, no edema, no redness temperature WNL. Will administer PRNs at this time.

## 2018-07-27 NOTE — Progress Notes (Signed)
Gauze/dressing around JP drain saturated, light sero sengous, removed gauze, site looks intact, dry, clean, placed split gauze around drain and taped. Continues to have plenty of output via JP drain. Will continue to monitor.

## 2018-07-28 LAB — BASIC METABOLIC PANEL
Anion gap: 5 (ref 5–15)
BUN: 15 mg/dL (ref 8–23)
CALCIUM: 7.7 mg/dL — AB (ref 8.9–10.3)
CHLORIDE: 104 mmol/L (ref 98–111)
CO2: 24 mmol/L (ref 22–32)
Creatinine, Ser: 1.6 mg/dL — ABNORMAL HIGH (ref 0.44–1.00)
GFR calc Af Amer: 34 mL/min — ABNORMAL LOW (ref >60)
GFR calc non Af Amer: 29 mL/min — ABNORMAL LOW (ref >60)
GLUCOSE: 104 mg/dL — AB (ref 70–99)
Potassium: 3.5 mmol/L (ref 3.5–5.1)
Sodium: 133 mmol/L — ABNORMAL LOW (ref 135–145)

## 2018-07-28 LAB — CREATININE, FLUID (PLEURAL, PERITONEAL, JP DRAINAGE): CREAT FL: 1.6 mg/dL

## 2018-07-28 LAB — HEMOGLOBIN AND HEMATOCRIT, BLOOD
HCT: 29.1 % — ABNORMAL LOW (ref 36.0–46.0)
Hemoglobin: 8.6 g/dL — ABNORMAL LOW (ref 12.0–15.0)

## 2018-07-28 MED ORDER — ENSURE ENLIVE PO LIQD
237.0000 mL | ORAL | Status: DC
  Administered 2018-07-30: 237 mL via ORAL

## 2018-07-28 MED ORDER — PREMIER PROTEIN SHAKE
11.0000 [oz_av] | Freq: Two times a day (BID) | ORAL | Status: DC
  Administered 2018-07-28 – 2018-07-29 (×4): 11 [oz_av] via ORAL
  Filled 2018-07-28 (×7): qty 325.31

## 2018-07-28 MED ORDER — ENSURE ENLIVE PO LIQD: 237.0000 mL | Freq: Three times a day (TID) | ORAL | Status: DC

## 2018-07-28 NOTE — Progress Notes (Signed)
Patient ID: Colleen Hughes, female   DOB: 1936-08-13, 82 y.o.   MRN: [id number]  4 Days Post-Op Subjective: Pt feeling much better over last 24 hours.  She began having flatus and bowel movements (x 2) yesterday with resolution of nausea and vomiting.  Tolerating clear liquids.  Objective: Vital signs in last 24 hours: Temp:  [97.4 F (36.3 C)-98.5 F (36.9 C)] 97.4 F (36.3 C) (10/13 0516) Pulse Rate:  [71-91] 71 (10/13 0516) Resp:  [14-18] 14 (10/13 0516) BP: (104-120)/(64-68) 104/64 (10/13 0516) SpO2:  [97 %-99 %] 97 % (10/13 0516)  Intake/Output from previous day: 10/12 0701 - 10/13 0700 In: 1981.4 [P.O.:362; I.V.:1619.4] Out: 1960 [Urine:1050; Drains:910] Intake/Output this shift: Total I/O In: 961.9 [P.O.:362; I.V.:599.9] Out: 1400 [Urine:1050; Drains:350]  Physical Exam:  General: Alert and oriented CV: RRR Lungs: Clear Abdomen: Soft, ND, NT with positive bowel sounds, urostomy pink with clear urine draining into bag, stents in place Incisions: C/D/I Ext: NT, No erythema  Lab Results: Recent Labs    07/26/18 0437 07/27/18 0514 07/28/18 0410  HGB 8.8* 9.5* 8.6*  HCT 29.6* 31.8* 29.1*   BMET Recent Labs    07/27/18 0514 07/28/18 0410  NA 136 133*  K 4.0 3.5  CL 105 104  CO2 26 24  GLUCOSE 108* 104*  BUN 14 15  CREATININE 1.15* 1.60*  CALCIUM 8.0* 7.7*     Studies/Results: Drain Cr 1.6  Assessment/Plan: POD # 4 s/p RAL cystectomy and ileal conduit - Advance diet - Monitor renal function and Hgb (will continue pharmacologic prophylaxis for now pending labs tomorrow) - Ambulate, IS - D/C drain - Continue PT/ostomy teaching   LOS: 5 days   Colleen Hughes,LES 07/28/2018, 6:53 AM

## 2018-07-29 DIAGNOSIS — E43 Unspecified severe protein-calorie malnutrition: Secondary | ICD-10-CM

## 2018-07-29 LAB — HEMOGLOBIN AND HEMATOCRIT, BLOOD
HCT: 30.3 % — ABNORMAL LOW (ref 36.0–46.0)
HEMOGLOBIN: 9.1 g/dL — AB (ref 12.0–15.0)

## 2018-07-29 LAB — BASIC METABOLIC PANEL
Anion gap: 9 (ref 5–15)
BUN: 19 mg/dL (ref 8–23)
CALCIUM: 9 mg/dL (ref 8.9–10.3)
CO2: 27 mmol/L (ref 22–32)
Chloride: 103 mmol/L (ref 98–111)
Creatinine, Ser: 1.57 mg/dL — ABNORMAL HIGH (ref 0.44–1.00)
GFR calc non Af Amer: 30 mL/min — ABNORMAL LOW (ref >60)
GFR, EST AFRICAN AMERICAN: 35 mL/min — AB (ref >60)
Glucose, Bld: 98 mg/dL (ref 70–99)
Potassium: 4 mmol/L (ref 3.5–5.1)
SODIUM: 139 mmol/L (ref 135–145)

## 2018-07-29 NOTE — Progress Notes (Signed)
Physical Therapy Treatment Patient Details Name: Colleen Hughes MRN: [id number] DOB: 04-27-1936 Today's Date: 07/29/2018    History of Present Illness 82 YO female s/p cystoscopy, robotic assisted laparoscopic radial cystectomy with bilateral oophrectomy and pelvic lymph node dissection, ileal conduit urinary diversion, post-op ileus on 10/9. Pt admitted 10/5 suprapubic pain, distention, history of bladder cancer and has a foley catheter in a place for past 2.5 weeks. Past medical and surgical history includes arthitis, basal cell skin cancer, transurethral resection of bladder tumor.     PT Comments    Pt with improved ambulation distance and decreased abdominal pain with mobility today. Pt ambulated 270 ft with RW with supervision to min guard assist. Pt also with improved tolerance and performance of LE exercises. Pt with period of lightheadedness after ambulation, but BP and HR WNL (113/89, 83 bpm). PT to continue to reinforce mobility with abdominal consideration, and will continue to progress mobility as tolerated. Will continue to follow.    Follow Up Recommendations  Home health PT;Supervision for mobility/OOB     Equipment Recommendations  None recommended by PT    Recommendations for Other Services       Precautions / Restrictions Precautions Precautions: None Precaution Comments: abdominal surgery  Restrictions Weight Bearing Restrictions: No    Mobility  Bed Mobility Overal bed mobility: Needs Assistance Bed Mobility: Supine to Sit     Supine to sit: Supervision;HOB elevated     General bed mobility comments: Supervision for safety. Pt with increased time and effort to perform, use of bedrails to move supine to sit.   Transfers Overall transfer level: Needs assistance Equipment used: Rolling walker (2 wheeled) Transfers: Sit to/from Stand Sit to Stand: Min guard         General transfer comment: Min guard for safety, no verbal cuing required for hand  placement.   Ambulation/Gait Ambulation/Gait assistance: Min Gaffer (Feet): 270 Feet Assistive device: Rolling walker (2 wheeled) Gait Pattern/deviations: Decreased stride length;Step-through pattern;Trunk flexed;Narrow base of support Gait velocity: slightly decr   General Gait Details: Min guard to supervision for safety. Pt with varying gait speed, starting with normal gait speed and slowing with increased ambulation distance. Pt with feeling of "weakness in my head" after ambulation, BP and HR 113/89 and 83 bpm, respectively.   Stairs             Wheelchair Mobility    Modified Rankin (Stroke Patients Only)       Balance Overall balance assessment: Needs assistance Sitting-balance support: No upper extremity supported Sitting balance-Leahy Scale: Good     Standing balance support: Bilateral upper extremity supported Standing balance-Leahy Scale: Fair Standing balance comment: unable to accept challenge, unsteadiness without UE support                             Cognition Arousal/Alertness: Awake/alert Behavior During Therapy: WFL for tasks assessed/performed Overall Cognitive Status: Within Functional Limits for tasks assessed                                        Exercises General Exercises - Lower Extremity Ankle Circles/Pumps: AROM;Both;20 reps;Seated Long Arc Quad: AROM;Both;15 reps;Seated Hip ABduction/ADduction: AROM;Both;15 reps;Seated Straight Leg Raises: AROM;Both;10 reps;Seated Hip Flexion/Marching: AROM;Both;10 reps;Seated    General Comments        Pertinent Vitals/Pain Pain Assessment: 0-10 Pain Score:  1  Pain Location: abdomen, due to incisions and gas  Pain Descriptors / Indicators: Aching;Sore Pain Intervention(s): Limited activity within patient's tolerance;Repositioned;Monitored during session    Home Living                      Prior Function            PT Goals  (current goals can now be found in the care plan section) Acute Rehab PT Goals Patient Stated Goal: go home  PT Goal Formulation: With patient Time For Goal Achievement: 08/09/18 Potential to Achieve Goals: Good Progress towards PT goals: Progressing toward goals    Frequency    Min 3X/week      PT Plan Current plan remains appropriate    Co-evaluation              AM-PAC PT "6 Clicks" Daily Activity  Outcome Measure  Difficulty turning over in bed (including adjusting bedclothes, sheets and blankets)?: None Difficulty moving from lying on back to sitting on the side of the bed? : A Little Difficulty sitting down on and standing up from a chair with arms (e.g., wheelchair, bedside commode, etc,.)?: A Little Help needed moving to and from a bed to chair (including a wheelchair)?: A Little Help needed walking in hospital room?: A Little Help needed climbing 3-5 steps with a railing? : A Little 6 Click Score: 19    End of Session Equipment Utilized During Treatment: Gait belt Activity Tolerance: Patient tolerated treatment well;Patient limited by fatigue Patient left: in chair;with chair alarm set;with family/visitor present;with call bell/phone within reach;with SCD's reapplied Nurse Communication: Mobility status PT Visit Diagnosis: Unsteadiness on feet (R26.81);Difficulty in walking, not elsewhere classified (R26.2)     Time: 6045-4098 PT Time Calculation (min) (ACUTE ONLY): 27 min  Charges:  $Gait Training: 8-22 mins $Therapeutic Exercise: 8-22 mins                     Julien Girt, PT Acute Rehabilitation Services Pager [phone]  Office [phone]   Daysia Vandenboom D Glynda Soliday 07/29/2018, 1:10 PM

## 2018-07-29 NOTE — Consult Note (Signed)
[place] Nurse ostomy follow up Stoma type/location: RLQ ileal conduit Stomal assessment/size: 1 and 1/8 inch Peristomal assessment: intact, dry Treatment options for stomal/peristomal skin: skin barrier ring Output : clear yellow urine Ostomy pouching: 1pc.convex with skin barrier ring Education provided: Patient's daughter-in-law performs pouch change with minimal cueing. Reinforced instructions.  Obtained supplies and took to bedside. Enrolled patient in [place] Start Discharge program: Yes  [place] nursing team will follow, and will remain available to this patient, the nursing and medical teams.  Thanks, Maudie Flakes, MSN, RN, [place], Arther Abbott  Pager# [phone]

## 2018-07-29 NOTE — Care Management Note (Signed)
Case Management Note  Patient Details  Name: Colleen Hughes MRN: [id number] Date of Birth: 12/04/1935  Subjective/Objective:   Pt admitted with Bladder Cancer.                 Action/Plan: Pt plan to discharge home with [facility] for HHRN/PT   Expected Discharge Date:  (unknown)               Expected Discharge Plan:  [place]  In-House Referral:     Discharge planning Services  CM Consult  Post Acute Care Choice:    Choice offered to:     DME Arranged:    DME Agency:     HH Arranged:  RN [place] Agency:  [facility]  Status of Service:  Completed, signed off  If discussed at [place] of Stay Meetings, dates discussed:    Additional CommentsPurcell Mouton, RN 07/29/2018, 12:26 PM

## 2018-07-29 NOTE — Care Management Important Message (Signed)
Important Message  Patient Details  Name: Colleen Hughes MRN: [id number] Date of Birth: 1936-04-16   Medicare Important Message Given:  Yes    Kerin Salen 07/29/2018, 1:21 PMImportant Message  Patient Details  Name: Colleen Hughes MRN: [id number] Date of Birth: 1936-03-14   Medicare Important Message Given:  Yes    Kerin Salen 07/29/2018, 1:20 PM

## 2018-07-29 NOTE — Progress Notes (Signed)
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 Path pending. Admitted 10/8 for bowel prep and stomal marking. Observed stepdown POD 0 and transferred to med-surg floor POD 1. JP removed 10/13 as output minimal and Cr same as serum.   2 - Post-Op Ileus - s/p bowel anastamosis as part of surgery 10/9. NPO with ice chips initially, advanced to clears. Return of bowel function 10/13 and placed on reg diet.   3- Disposition / Rehab - pt completely independent at baseline. PT nd [place] for new urostomy treaching / supplies.   4 - Acute on Chronic Anemia - Hgb 9-10 baseline. S/p cystetomy with some (not large) blood loss 10/9. Has blood avail if transfusion indications arise.   Today "Colleen Hughes" is without compliants. Toleratign reg diet now, ambulating more, pain controlled.    Objective: Vital signs in last 24 hours: Temp:  [97.8 F (36.6 C)-98.1 F (36.7 C)] 97.8 F (36.6 C) (10/14 0434) Pulse Rate:  [86-91] 91 (10/14 0434) Resp:  [18] 18 (10/14 0434) BP: (109-114)/(66-72) 113/66 (10/14 0434) SpO2:  [98 %-100 %] 98 % (10/14 0434) Last BM Date: 07/23/18  Intake/Output from previous day: 10/13 0701 - 10/14 0700 In: -  Out: 1020 [Urine:925; Drains:95] Intake/Output this shift: No intake/output data recorded.   General appearance: alert, cooperative and appears stated age Eyes: negative Nose: Nares normal. Septum midline. Mucosa normal. No drainage or sinus tenderness. Throat: lips, mucosa, and tongue normal; teeth and gums normal Neck: supple, symmetrical, trachea midline Back: symmetric, no curvature. ROM normal. No CVA tenderness. Resp: non-labored on minimal [place] O2 Cardio: HR 90s by bedside monitor.  GI: soft, non-tender; bowel sounds normal; no masses,  no organomegaly Pelvic: external genitalia normal and scant  serosanguinal spotting from vaginal cuff Extremities: extremities normal, atraumatic, no cyanosis or edema Pulses: 2+ and symmetric Neurologic: Grossly normal Incision/Wound: RLQ Urostomy pink / patent with copious non-foul urine and Rt (red) and Lt (blud) distal stents. JP now out, site c/d/i with dry dressing. SCD's in place.    Lab Results:  Recent Labs    07/28/18 0410 07/29/18 0448  HGB 8.6* 9.1*  HCT 29.1* 30.3*   BMET Recent Labs    07/28/18 0410 07/29/18 0448  NA 133* 139  K 3.5 4.0  CL 104 103  CO2 24 27  GLUCOSE 104* 98  BUN 15 19  CREATININE 1.60* 1.57*  CALCIUM 7.7* 9.0   PT/INR No results for input(s): LABPROT, INR in the last 72 hours. ABG No results for input(s): PHART, HCO3 in the last 72 hours.  Invalid input(s): PCO2, PO2  Studies/Results: No results found.  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 Path pending.   2 - Post-Op Ileus -  Continue reg diet, ileus resolved.   3- Disposition / Rehab - orders placed for [facility] Likely DC tomorrow based on current progress.   4 - Acute on Chronic Anemia - Hgb stable.    [facility], Jonel Weldon 07/29/2018

## 2018-07-30 MED ORDER — PROMETHAZINE HCL 25 MG/ML IJ SOLN: 12.5000 mg | Freq: Four times a day (QID) | INTRAMUSCULAR | Status: DC | PRN

## 2018-07-30 MED ORDER — TRAMADOL HCL 50 MG PO TABS: 50.0000 mg | ORAL_TABLET | Freq: Four times a day (QID) | ORAL | 0 refills | Status: AC | PRN

## 2018-07-30 NOTE — Consult Note (Signed)
[place] Nurse ostomy follow up Stoma type/location: RLQ ileal conduit with 2 stents intact.  Red = right, Blue = left Stomal assessment/size: 1 and 1/8 inch round, red, moist. Os at center Peristomal assessment: not seen today.  Pouch applied yesterday is intact. Treatment options for stomal/peristomal skin: skin barrier ring Output: clear yellow urine Ostomy pouching: 1pc.convex with skin barrier ring  Education provided: Patient wants to know who to contact with questions and is reminded that she will have a HHRN for a few weeks and that she will be seeing Dr. Tresa Moore in follow up for several weeks/months in his office. For discharge today. Enrolled patient in [place] Start Discharge program: Yes  [place] nursing team will not follow out of house, but will remain available to this patient, the nursing and medical teams.   Thanks, Maudie Flakes, MSN, RN, [place], Arther Abbott  Pager# [phone]

## 2018-07-30 NOTE — Progress Notes (Signed)
Physical Therapy Treatment Patient Details Name: Colleen Hughes MRN: [id number] DOB: Jan 17, 1936 Today's Date: 07/30/2018    History of Present Illness 82 YO female s/p cystoscopy, robotic assisted laparoscopic radial cystectomy with bilateral oophrectomy and pelvic lymph node dissection, ileal conduit urinary diversion, post-op ileus on 10/9. Pt admitted 10/5 suprapubic pain, distention, history of bladder cancer and has a foley catheter in a place for past 2.5 weeks. Past medical and surgical history includes arthitis, basal cell skin cancer, transurethral resection of bladder tumor.     PT Comments    Pt demonstrating proficiency in bed mobility tasks, requiring min verbal cuing initially to perform log roll. Pt ambulated well with RW yesterday, and defers ambulation today due to fatigue and some lightheadedness. Pt demostrated LE exercises adequately, pt with LE exercise handout to take home with her. Pt encouraged to perform exercises within her comfort, and if her abdomen felt painful or like it was being strained by exercises to stop. Pt with no painful symptoms during demonstration of LE exercises today. Pt to d/c today, if not will continue to follow acutely.    Follow Up Recommendations  Home health PT;Supervision for mobility/OOB     Equipment Recommendations  None recommended by PT    Recommendations for Other Services       Precautions / Restrictions Precautions Precautions: None Precaution Comments: abdominal surgery  Restrictions Weight Bearing Restrictions: No    Mobility  Bed Mobility Overal bed mobility: Needs Assistance Bed Mobility: Rolling;Sidelying to Sit;Sit to Sidelying Rolling: Supervision Sidelying to sit: Supervision     Sit to sidelying: Supervision General bed mobility comments: Supervision for safety. Reinforced technique during log roll to protect and improve comfort of pt's abdomen. HOB flat to prepare for d/c home.   Transfers Overall transfer  level: (Pt deferred, has already been ambulating in room today per pt )                  Ambulation/Gait                 Stairs             Wheelchair Mobility    Modified Rankin (Stroke Patients Only)       Balance Overall balance assessment: Needs assistance Sitting-balance support: No upper extremity supported Sitting balance-Leahy Scale: Good     Standing balance support: Bilateral upper extremity supported Standing balance-Leahy Scale: Fair Standing balance comment: unable to accept challenge, unsteadiness without UE support                             Cognition Arousal/Alertness: Awake/alert Behavior During Therapy: WFL for tasks assessed/performed Overall Cognitive Status: Within Functional Limits for tasks assessed                                        Exercises General Exercises - Lower Extremity Ankle Circles/Pumps: AROM;Both;20 reps;Supine Quad Sets: AROM;Both;10 reps;Supine Hip ABduction/ADduction: AROM;Both;Seated;10 reps Straight Leg Raises: AROM;Both;10 reps;Supine    General Comments        Pertinent Vitals/Pain Pain Assessment: Faces Faces Pain Scale: Hurts a little bit Pain Location: abdomen, due to incisions with mobility  Pain Descriptors / Indicators: Tender;Sore Pain Intervention(s): Limited activity within patient's tolerance;Monitored during session;Repositioned    Home Living  Prior Function            PT Goals (current goals can now be found in the care plan section) Acute Rehab PT Goals Patient Stated Goal: go home  PT Goal Formulation: With patient Time For Goal Achievement: 08/09/18 Potential to Achieve Goals: Good Progress towards PT goals: Progressing toward goals    Frequency    Min 3X/week      PT Plan Current plan remains appropriate    Co-evaluation              AM-PAC PT "6 Clicks" Daily Activity  Outcome Measure   Difficulty turning over in bed (including adjusting bedclothes, sheets and blankets)?: None Difficulty moving from lying on back to sitting on the side of the bed? : None Difficulty sitting down on and standing up from a chair with arms (e.g., wheelchair, bedside commode, etc,.)?: None Help needed moving to and from a bed to chair (including a wheelchair)?: A Little Help needed walking in hospital room?: A Little Help needed climbing 3-5 steps with a railing? : A Little 6 Click Score: 21    End of Session Equipment Utilized During Treatment: Gait belt Activity Tolerance: Patient tolerated treatment well;Patient limited by fatigue Patient left: with call bell/phone within reach;in bed   PT Visit Diagnosis: Unsteadiness on feet (R26.81);Difficulty in walking, not elsewhere classified (R26.2)     Time: 4982-6415 PT Time Calculation (min) (ACUTE ONLY): 15 min  Charges:  $Therapeutic Activity: 8-22 mins                     Alford Gamero Conception Chancy, PT Acute Rehabilitation Services Pager [phone]  Office [phone]   Ke[place]and Pickering D Elonda Husky 07/30/2018, 12:57 PM

## 2018-07-30 NOTE — Progress Notes (Signed)
Pt discharged to home with husband, instruction reviewed as ordered. Ostomy teaching completed. [place] Nurse completed teaching .Supplies left at bedside for pt per [facility] Specialist request. Pt calm questions addressed. SRP, RN

## 2018-07-30 NOTE — Discharge Summary (Signed)
Physician Discharge Summary  Patient ID: Colleen Hughes MRN: [id number] DOB/AGE: [birth year] 82 y.o.  Admit date: 07/23/2018 Discharge date: 07/30/2018  Admission Diagnoses: Bladder Cancer  Discharge Diagnoses:  Active Problems:   Bladder cancer ([place])   Protein-calorie malnutrition, severe   Discharged Condition: fair  Hospital Course:   1 - Metastatic Bladder Cancer with Malignant Hydronephrosis - s/p robotic cystectomy / node dissection / conduit diversion 10/9 for pT4N1large lateral cancer. Admitted 10/8 for bowel prep and stomal marking. Observed stepdown POD 0 and transferred to med-surg floor POD 1. JP removed 10/13 as output minimal and Cr same as serum.   2 - Post-Op Ileus - s/p bowel anastamosis as part of surgery 10/9. NPO with ice chips initially, advanced to clears. Return of bowel function 10/13 and placed on reg diet.   3- Disposition / Rehab - pt completely independent at baseline. PT and ostomey RN recs HHRN for new urostomy treaching / supplies. This is arranged at discharge via [facility].  4 - Acute on Chronic Anemia - Hgb 9-10 baseline. S/p cystetomy with some (not large) blood loss 10/9. Hgb at DC 9.1 (at baseline)  By the AM of 10/15, the day of discharge, Colleen Hughes is ambulatory, pain controlled on PO meds, maintining PO nutrition and felt to be adequate for discharge.   Consults: Ostomy RN, Case management  Significant Diagnostic Studies: labs: as per above  Treatments: surgery: as per above  Discharge Exam: Blood pressure 103/63, pulse 82, temperature 97.6 F (36.4 C), temperature source Oral, resp. rate 12, height 5\' 6"  (1.676 m), weight 45.2 kg, SpO2 99 %.  alert, cooperative and appears stated age Eyes:negative Nose:Nares normal. Septum midline. Mucosa normal. No drainage or sinus tenderness. Throat:lips, mucosa, and tongue normal; teeth and gums normal Neck:supple, symmetrical, trachea midline Back:symmetric, no curvature. ROM normal. No  CVA tenderness. Resp:non-labored on minimal [place] O2 Cardio:HR 90s by bedside monitor. MP:NTIR, non-tender; bowel sounds normal; no masses, no organomegaly Pelvic:external genitalia normal andscant serosanguinal spotting from vaginal cuff. No erosion / prolapse. Extremities:extremities normal, atraumatic, no cyanosis or edema Pulses:2+ and symmetric Neurologic:Grossly normal Incision/Wound:RLQ Urostomy pink / patent with copious non-foul urine and Rt (red) and Lt (blud) distal stents. JP now out, site c/d/i with dry dressing. SCD's in place.   Disposition:    Allergies as of 07/30/2018      Reactions   Codeine Nausea Only      Medication List    TAKE these medications   acetaminophen 500 MG tablet Commonly known as:  TYLENOL Take 500-1,000 mg by mouth every 6 (six) hours as needed for moderate pain or headache.   aspirin EC 81 MG tablet Take 81 mg by mouth daily.   CITRACAL +D3 PO Take 2 tablets by mouth daily.   multivitamin with minerals Tabs tablet Take 1 tablet by mouth daily. [employer] Multivitamin   traMADol 50 MG tablet Commonly known as:  ULTRAM Take 1-2 tablets (50-100 mg total) by mouth every 6 (six) hours as needed for moderate pain or severe pain. Post-operatively   triamterene-hydrochlorothiazide 37.5-25 MG tablet Commonly known as:  MAXZIDE-25 Take 1 tablet by mouth daily.   Vitamin D3 2000 units Tabs Take 2,000 Units by mouth daily.      Follow-up Information    Alexis Frock, MD On 08/15/2018.   Specialty:  Urology Why:  at 11:15 Contact information: [place] [address] [phone]           Signed: Alexis Frock 07/30/2018,  7:14 AM

## 2018-09-17 ENCOUNTER — Inpatient Hospital Stay: Payer: Medicare Other | Attending: Oncology

## 2018-09-17 ENCOUNTER — Inpatient Hospital Stay (HOSPITAL_BASED_OUTPATIENT_CLINIC_OR_DEPARTMENT_OTHER): Payer: Medicare Other | Admitting: Oncology

## 2018-09-17 VITALS — BP 141/86 | HR 85 | Temp 98.0°F | Resp 18 | Ht 66.0 in | Wt 102.0 lb

## 2018-09-17 DIAGNOSIS — Z79899 Other long term (current) drug therapy: Secondary | ICD-10-CM

## 2018-09-17 DIAGNOSIS — D631 Anemia in chronic kidney disease: Secondary | ICD-10-CM | POA: Insufficient documentation

## 2018-09-17 DIAGNOSIS — N189 Chronic kidney disease, unspecified: Secondary | ICD-10-CM | POA: Diagnosis not present

## 2018-09-17 DIAGNOSIS — Z7982 Long term (current) use of aspirin: Secondary | ICD-10-CM | POA: Diagnosis not present

## 2018-09-17 DIAGNOSIS — C679 Malignant neoplasm of bladder, unspecified: Secondary | ICD-10-CM

## 2018-09-17 LAB — CMP (CANCER CENTER ONLY)
ALBUMIN: 3.2 g/dL — AB (ref 3.5–5.0)
ALT: 6 U/L (ref 0–44)
AST: 11 U/L — AB (ref 15–41)
Alkaline Phosphatase: 46 U/L (ref 38–126)
Anion gap: 10 (ref 5–15)
BUN: 37 mg/dL — AB (ref 8–23)
CHLORIDE: 109 mmol/L (ref 98–111)
CO2: 22 mmol/L (ref 22–32)
Calcium: 9.1 mg/dL (ref 8.9–10.3)
Creatinine: 1.43 mg/dL — ABNORMAL HIGH (ref 0.44–1.00)
GFR, EST NON AFRICAN AMERICAN: 35 mL/min — AB (ref >60)
GFR, Est AFR Am: 40 mL/min — ABNORMAL LOW (ref >60)
Glucose, Bld: 89 mg/dL (ref 70–99)
POTASSIUM: 4.6 mmol/L (ref 3.5–5.1)
SODIUM: 141 mmol/L (ref 135–145)
Total Bilirubin: 0.2 mg/dL — ABNORMAL LOW (ref 0.3–1.2)
Total Protein: 6.8 g/dL (ref 6.5–8.1)

## 2018-09-17 LAB — CBC WITH DIFFERENTIAL (CANCER CENTER ONLY)
Abs Immature Granulocytes: 0.11 10*3/uL — ABNORMAL HIGH (ref 0.00–0.07)
BASOS ABS: 0 10*3/uL (ref 0.0–0.1)
BASOS PCT: 0 %
EOS ABS: 0.3 10*3/uL (ref 0.0–0.5)
EOS PCT: 2 %
HCT: 29.5 % — ABNORMAL LOW (ref 36.0–46.0)
Hemoglobin: 8.8 g/dL — ABNORMAL LOW (ref 12.0–15.0)
Immature Granulocytes: 1 %
Lymphocytes Relative: 39 %
Lymphs Abs: 4.4 10*3/uL — ABNORMAL HIGH (ref 0.7–4.0)
MCH: 26.4 pg (ref 26.0–34.0)
MCHC: 29.8 g/dL — AB (ref 30.0–36.0)
MCV: 88.6 fL (ref 80.0–100.0)
Monocytes Absolute: 0.6 10*3/uL (ref 0.1–1.0)
Monocytes Relative: 5 %
NEUTROS ABS: 6 10*3/uL (ref 1.7–7.7)
Neutrophils Relative %: 53 %
PLATELETS: 429 10*3/uL — AB (ref 150–400)
RBC: 3.33 MIL/uL — ABNORMAL LOW (ref 3.87–5.11)
RDW: 17.2 % — ABNORMAL HIGH (ref 11.5–15.5)
WBC Count: 11.4 10*3/uL — ABNORMAL HIGH (ref 4.0–10.5)
nRBC: 0 % (ref 0.0–0.2)

## 2018-09-17 NOTE — Progress Notes (Signed)
Hematology and Oncology Follow Up Visit  Colleen Hughes [id number] 1936/01/06 82 y.o. 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  She was found to have T4N1 disease after radical cystectomy in October 2019.   Prior Therapy:  Robotic assisted laparoscopic cystectomy with ileal conduit formation.  She is also bilateral lymphadenectomy completed on July 24, 2018.  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  Current therapy: Active surveillance.  Interim History: [name] presents today for a follow-up visit.  Since the last visit, she completed a radical cystectomy and bilateral lymphadenectomy completed on 07/24/2018.  She tolerated the procedure well without any major postoperative complications.  She did have ileus which has resolved at this time.  She has resumed most activities of daily living except for driving at this time.  She is eating better and regaining her strength and appetite.  She does not report any headaches, blurry vision, syncope or seizures. Does not report any fevers, chills or sweats.  Does not report any cough, wheezing or hemoptysis.  Does not report any chest pain, palpitation, orthopnea or leg edema.  Does not report any nausea, vomiting or abdominal pain.  Does not report any constipation or diarrhea.  Does not report any skeletal complaints.    Does not report frequency, urgency or hematuria.  Does not report any skin rashes or lesions. Does not report any heat or cold intolerance.  Does not report any lymphadenopathy or petechiae.  Does not report any anxiety or depression.  Remaining review of systems is negative.    Medications: I have reviewed the patient's current medications.  Current Outpatient Medications  Medication Sig Dispense Refill   . acetaminophen (TYLENOL) 500 MG tablet Take 500-1,000 mg by mouth every 6 (six) hours as needed for moderate pain or headache.     Marland Kitchen aspirin EC 81 MG tablet Take 81 mg by mouth daily.    . Calcium-Phosphorus-Vitamin D (CITRACAL +D3 PO) Take 2 tablets by mouth daily.    . Cholecalciferol (VITAMIN D3) 2000 units TABS Take 2,000 Units by mouth daily.    . Multiple Vitamin (MULTIVITAMIN WITH MINERALS) TABS tablet Take 1 tablet by mouth daily. [employer] [facility] Multivitamin    . traMADol (ULTRAM) 50 MG tablet Take 1-2 tablets (50-100 mg total) by mouth every 6 (six) hours as needed for moderate pain or severe pain. Post-operatively 20 tablet 0  . triamterene-hydrochlorothiazide (MAXZIDE-25) 37.5-25 MG tablet Take 1 tablet by mouth daily.     No current facility-administered medications for this visit.      Allergies:  Allergies  Allergen Reactions  . Codeine Nausea Only    Past Medical History, Surgical history, Social history, and Family History were reviewed and updated.    Physical Exam: Blood pressure (!) 141/86, pulse 85, temperature 98 F (36.7 C), temperature source Oral, resp. rate 18, height 5\' 6"  (1.676 m), weight 102 lb (46.3 kg), SpO2 100 %.   ECOG: 1 General appearance: alert and cooperative appeared without distress. Head: Normocephalic, without obvious abnormality Oropharynx: No oral thrush or ulcers. Eyes: No scleral icterus.  Pupils are equal and round reactive to light. Lymph nodes: Cervical, supraclavicular, and axillary nodes normal. Heart:regular rate and rhythm, S1, S2 normal, no murmur, click, rub or gallop Lung:chest clear, no wheezing, rales, normal symmetric air entry Abdomin: soft, non-tender,  without masses or organomegaly. Neurological: No motor, sensory deficits.  Intact deep tendon reflexes. Skin: No rashes or lesions.  No ecchymosis or petechiae. Musculoskeletal: No joint deformity or effusion. Psychiatric: Mood and affect are appropriate.    Lab  Results: Lab Results  Component Value Date   WBC 20.7 (H) 07/23/2018   HGB 9.1 (L) 07/29/2018   HCT 30.3 (L) 07/29/2018   MCV 86.0 07/23/2018   PLT 477 (H) 07/23/2018     Chemistry      Component Value Date/Time   NA 139 07/29/2018 0448   K 4.0 07/29/2018 0448   CL 103 07/29/2018 0448   CO2 27 07/29/2018 0448   BUN 19 07/29/2018 0448   CREATININE 1.57 (H) 07/29/2018 0448      Component Value Date/Time   CALCIUM 9.0 07/29/2018 0448   ALKPHOS 42 07/23/2018 1151   AST 13 (L) 07/23/2018 1151   ALT 9 07/23/2018 1151   BILITOT 0.9 07/23/2018 1151       Impression and Plan:  82 year old woman with the following:  1.  Urothelial carcinoma arising from the bladder diagnosed in August 2019.  She underwent radical cystectomy and lymphadenectomy in October 2019.  The final pathology showed a high-grade poorly differentiated urothelial carcinoma with 1 out of 6 lymph nodes noted in the left external iliac.  The remaining bilateral lymph node dissection showed no evidence of malignancy.  Her disease status was discussed today and treatment options were reviewed.  She is at high risk of developing systemic disease and the role of adjuvant chemotherapy was reviewed.  She is not a cisplatin candidate at this time due to her creatinine clearance.  Adjuvant carboplatin and gemcitabine would be an option although there is no clear evidence that this offers survival advantage.  After discussion today, we have elected to repeat imaging studies in 4 weeks and determine best course of action after that.  She has measurable disease, we will proceed with systemic chemotherapy.  2.  Renal insufficiency: I will recheck her creatinine clearance at this time and assess whether that was related to her hydronephrosis versus inherently kidney disease.  3.  Anemia: Related to her surgery and chronic renal insufficiency.  Hemoglobin is adequate I will continue to monitor.  4.  Follow-up: We will be in 4 weeks  to follow her progress.  15  minutes was spent with the patient face-to-face today.  More than 50% of time was dedicated to reviewing her disease status, pathology report, treatment options and complications related to therapy.    Zola Button, MD 12/3/20198:58 AM

## 2018-10-18 ENCOUNTER — Inpatient Hospital Stay: Payer: Medicare Other | Attending: Oncology

## 2018-10-18 ENCOUNTER — Ambulatory Visit (HOSPITAL_COMMUNITY)
Admission: RE | Admit: 2018-10-18 | Discharge: 2018-10-18 | Disposition: A | Discharge status: Home / Self Care | Payer: Medicare Other | Source: Ambulatory Visit | Attending: Oncology | Admitting: Oncology

## 2018-10-18 DIAGNOSIS — C679 Malignant neoplasm of bladder, unspecified: Secondary | ICD-10-CM | POA: Diagnosis present

## 2018-10-18 DIAGNOSIS — N289 Disorder of kidney and ureter, unspecified: Secondary | ICD-10-CM | POA: Insufficient documentation

## 2018-10-18 DIAGNOSIS — Z8551 Personal history of malignant neoplasm of bladder: Secondary | ICD-10-CM | POA: Diagnosis not present

## 2018-10-18 DIAGNOSIS — D649 Anemia, unspecified: Secondary | ICD-10-CM | POA: Diagnosis not present

## 2018-10-18 LAB — CBC WITH DIFFERENTIAL (CANCER CENTER ONLY)
Abs Immature Granulocytes: 0.03 10*3/uL (ref 0.00–0.07)
BASOS PCT: 1 %
Basophils Absolute: 0.1 10*3/uL (ref 0.0–0.1)
EOS ABS: 0.2 10*3/uL (ref 0.0–0.5)
EOS PCT: 2 %
HCT: 37.6 % (ref 36.0–46.0)
Hemoglobin: 11.4 g/dL — ABNORMAL LOW (ref 12.0–15.0)
IMMATURE GRANULOCYTES: 0 %
Lymphocytes Relative: 44 %
Lymphs Abs: 4.2 10*3/uL — ABNORMAL HIGH (ref 0.7–4.0)
MCH: 27.8 pg (ref 26.0–34.0)
MCHC: 30.3 g/dL (ref 30.0–36.0)
MCV: 91.7 fL (ref 80.0–100.0)
MONO ABS: 0.5 10*3/uL (ref 0.1–1.0)
MONOS PCT: 5 %
NEUTROS PCT: 48 %
Neutro Abs: 4.5 10*3/uL (ref 1.7–7.7)
PLATELETS: 281 10*3/uL (ref 150–400)
RBC: 4.1 MIL/uL (ref 3.87–5.11)
RDW: 16.2 % — AB (ref 11.5–15.5)
WBC Count: 9.4 10*3/uL (ref 4.0–10.5)
nRBC: 0 % (ref 0.0–0.2)

## 2018-10-18 LAB — CMP (CANCER CENTER ONLY)
ALT: 10 U/L (ref 0–44)
ANION GAP: 8 (ref 5–15)
AST: 18 U/L (ref 15–41)
Albumin: 3.7 g/dL (ref 3.5–5.0)
Alkaline Phosphatase: 51 U/L (ref 38–126)
BUN: 26 mg/dL — ABNORMAL HIGH (ref 8–23)
CHLORIDE: 104 mmol/L (ref 98–111)
CO2: 27 mmol/L (ref 22–32)
Calcium: 9.3 mg/dL (ref 8.9–10.3)
Creatinine: 1.37 mg/dL — ABNORMAL HIGH (ref 0.44–1.00)
GFR, EST AFRICAN AMERICAN: 42 mL/min — AB (ref >60)
GFR, Estimated: 36 mL/min — ABNORMAL LOW (ref >60)
Glucose, Bld: 91 mg/dL (ref 70–99)
POTASSIUM: 4.4 mmol/L (ref 3.5–5.1)
Sodium: 139 mmol/L (ref 135–145)
TOTAL PROTEIN: 7.1 g/dL (ref 6.5–8.1)
Total Bilirubin: 0.4 mg/dL (ref 0.3–1.2)

## 2018-10-22 ENCOUNTER — Telehealth: Payer: Self-pay | Admitting: Oncology

## 2018-10-22 ENCOUNTER — Inpatient Hospital Stay (HOSPITAL_BASED_OUTPATIENT_CLINIC_OR_DEPARTMENT_OTHER): Payer: Medicare Other | Admitting: Oncology

## 2018-10-22 VITALS — BP 140/84 | HR 89 | Temp 98.6°F | Resp 18 | Ht 66.0 in | Wt 110.1 lb

## 2018-10-22 DIAGNOSIS — Z8551 Personal history of malignant neoplasm of bladder: Secondary | ICD-10-CM | POA: Diagnosis not present

## 2018-10-22 DIAGNOSIS — N289 Disorder of kidney and ureter, unspecified: Secondary | ICD-10-CM | POA: Diagnosis not present

## 2018-10-22 DIAGNOSIS — D649 Anemia, unspecified: Secondary | ICD-10-CM | POA: Diagnosis not present

## 2018-10-22 DIAGNOSIS — C679 Malignant neoplasm of bladder, unspecified: Secondary | ICD-10-CM

## 2018-10-22 NOTE — Telephone Encounter (Signed)
FAXED RECORDS TO [facility]  [phone]

## 2018-10-22 NOTE — Telephone Encounter (Signed)
Printed calendar and avs. °

## 2018-10-22 NOTE — Progress Notes (Signed)
Hematology and Oncology Follow Up Visit  Colleen Hughes [id number] 02/03/1936 83 y.o. 10/22/2018 12:54 PM Colleen Hughes, MDGriffin, [name], MD   Principle Diagnosis: 83 year old woman with T4N1 urothelial carcinoma of the bladder diagnosed in August 2019.    Prior Therapy:  Robotic assisted laparoscopic cystectomy with ileal conduit formation.  She is also bilateral lymphadenectomy completed on July 24, 2018.  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  Current therapy: Active surveillance.  Interim History: [name] returns today for a repeat evaluation.  Since the last visit, she reports no major changes in her health.  She continues to recover well from her operation with improvement in her exercise tolerance.  She denies any issues with her urostomy including no irritation or bleeding.  She did have a vaginal prolapse that was evaluated by Dr. Tresa Moore without any complication.  Her performance status and quality of life remain excellent and she gained weight.  She does not report any headaches, blurry vision, syncope or seizures.  She denies any alteration mental status or confusion.  Does not report any fevers, chills or sweats.  Does not report any cough, wheezing or hemoptysis.  Does not report any chest pain, palpitation, orthopnea or leg edema.  Does not report any nausea, vomiting or distention. Does not report any changes in bowel habits. Does not report any arthralgias or myalgias.  Does not report frequency, urgency or hematuria.  Does not report any skin rashes or lesions.   Does not report any ecchymosis or bruising.  Does not report any changes in her mood.  Remaining review of systems is negative.    Medications: I have reviewed the patient's current medications.  Current Outpatient Medications  Medication Sig Dispense Refill  . acetaminophen (TYLENOL) 500 MG tablet Take  500-1,000 mg by mouth every 6 (six) hours as needed for moderate pain or headache.     Marland Kitchen aspirin EC 81 MG tablet Take 81 mg by mouth daily.    . Calcium-Phosphorus-Vitamin D (CITRACAL +D3 PO) Take 2 tablets by mouth daily.    . Cholecalciferol (VITAMIN D3) 2000 units TABS Take 2,000 Units by mouth daily.    . Multiple Vitamin (MULTIVITAMIN WITH MINERALS) TABS tablet Take 1 tablet by mouth daily. [employer] Multivitamin    . traMADol (ULTRAM) 50 MG tablet Take 1-2 tablets (50-100 mg total) by mouth every 6 (six) hours as needed for moderate pain or severe pain. Post-operatively 20 tablet 0  . triamterene-hydrochlorothiazide (MAXZIDE-25) 37.5-25 MG tablet Take 1 tablet by mouth daily.     No current facility-administered medications for this visit.      Allergies:  Allergies  Allergen Reactions  . Codeine Nausea Only    Past Medical History, Surgical history, Social history, and Family History were reviewed and updated.    Physical Exam: Blood pressure 140/84, pulse 89, temperature 98.6 F (37 C), temperature source Oral, resp. rate 18, height 5\' 6"  (1.676 m), weight 110 lb 1.6 oz (49.9 kg), SpO2 100 %.    ECOG: 1   General appearance: Comfortable appearing without any discomfort Head: Normocephalic without any trauma Oropharynx: Mucous membranes are moist and pink without any thrush or ulcers. Eyes: Pupils are equal and round reactive to light. Lymph nodes: No cervical, supraclavicular, inguinal or axillary lymphadenopathy.   Heart:regular rate and rhythm.  S1 and S2 without leg edema. Lung: Clear without any rhonchi or wheezes.  No dullness  to percussion. Abdomin: Soft, nontender, nondistended with good bowel sounds.  No hepatosplenomegaly. Musculoskeletal: No joint deformity or effusion.  Full range of motion noted. Neurological: No deficits noted on motor, sensory and deep tendon reflex exam. Skin: No petechial rash or dryness.  Appeared moist.      Lab  Results: Lab Results  Component Value Date   WBC 9.4 10/18/2018   HGB 11.4 (L) 10/18/2018   HCT 37.6 10/18/2018   MCV 91.7 10/18/2018   PLT 281 10/18/2018     Chemistry      Component Value Date/Time   NA 139 10/18/2018 1150   K 4.4 10/18/2018 1150   CL 104 10/18/2018 1150   CO2 27 10/18/2018 1150   BUN 26 (H) 10/18/2018 1150   CREATININE 1.37 (H) 10/18/2018 1150      Component Value Date/Time   CALCIUM 9.3 10/18/2018 1150   ALKPHOS 51 10/18/2018 1150   AST 18 10/18/2018 1150   ALT 10 10/18/2018 1150   BILITOT 0.4 10/18/2018 1150      EXAM: CT CHEST, ABDOMEN AND PELVIS WITHOUT CONTRAST  TECHNIQUE: Multidetector CT imaging of the chest, abdomen and pelvis was performed following the standard protocol without IV contrast.  COMPARISON:  CT abdomen/pelvis dated 05/17/2018  FINDINGS: CT CHEST FINDINGS  Cardiovascular: Heart is normal in size.  No pericardial effusion.  No evidence of thoracic aortic aneurysm. Atherosclerotic calcifications of the aortic arch.  Mild coronary atherosclerosis the LAD.  Mediastinum/Nodes: No suspicious mediastinal lymphadenopathy.  Visualized thyroid is unremarkable.  Lungs/Pleura: Biapical pleural-parenchymal scarring.  Partially calcified nodule in the posterior right upper lobe reflects additional site of pleural-parenchymal scarring (series 6/image 35), benign.  No suspicious pulmonary nodules.  No focal consolidation.  No pleural effusion or pneumothorax.  Musculoskeletal: Bilateral breast augmentation.  Mildly exaggerated lower thoracic kyphosis.  CT ABDOMEN PELVIS FINDINGS  Limited evaluation due to lack of intravenous contrast administration.  Hepatobiliary: Scattered hepatic cysts, measuring up to 14 mm in the right hepatic lobe (series 2/image 52).  Layering gallbladder sludge versus noncalcified gallstones (series 2/image 86). No associated inflammatory changes. No intrahepatic  or extrahepatic ductal dilatation.  Pancreas: Within normal limits.  Spleen: Within normal limits.  Adrenals/Urinary Tract: Adrenal glands are within normal limits.  1.8 cm lateral left upper pole renal cyst (series 2/image 60). Right kidney is within normal limits. No renal or ureteral calculi. No hydronephrosis.  Status post cystectomy with right mid abdominal ileal conduit.  Stomach/Bowel: Stomach is within normal limits.  No evidence of bowel obstruction.  Normal appendix (series 2/image 100).  Mild sigmoid diverticulosis, without evidence of diverticulitis.  Vascular/Lymphatic: No evidence of abdominal aortic aneurysm.  Atherosclerotic calcifications of the abdominal aorta and branch vessels.  No suspicious abdominopelvic lymphadenopathy.  Reproductive: Status post hysterectomy.  No adnexal masses  Other: Trace pelvic fluid.  Musculoskeletal: Grade 1 anterolisthesis of L5 on S1.  IMPRESSION: Limited evaluation due to lack of intravenous contrast administration.  Status post cystectomy with right mid abdominal ileal conduit.  No findings suspicious for recurrent or metastatic disease.  Additional ancillary findings as above.   Impression and Plan:  83 year old woman with:  1.  T4N1 urothelial carcinoma of the bladder diagnosed in August 2019.  She remains disease-free after radical cystectomy in October 2019.  CT scan obtained on October 18, 2018 was personally reviewed and discussed with the patient today.  Her laboratory data were also discussed and showed no evidence of relapsed disease.  Risks and benefits of continuing active surveillance  versus adjuvant systemic chemotherapy was discussed again.  She understands she is high risk of developing relapse disease but she would like to defer the option of chemotherapy unless needed in the future.  I recommended strict surveillance with repeat imaging studies in 4 months and subsequently  in 6 months.   2.  Renal insufficiency: Her creatinine clearance remains decreased around 35 cc an hour.  This will be prohibitive for using cisplatin chemotherapy.  3.  Anemia: Hemoglobin improved at this time without any transfusion.  4.  Follow-up: We will be in 4 months to follow her progress.  15  minutes was spent with the patient face-to-face today.  More than 50% of time was dedicated to discussing imaging studies, laboratory data and answering question regarding future plan of care.    Zola Button, MD 1/7/202012:54 PM

## 2018-10-25 ENCOUNTER — Telehealth: Payer: Self-pay | Admitting: *Deleted

## 2018-10-25 NOTE — Telephone Encounter (Signed)
Medical records faxed to [facility]; RID [id number]

## 2018-10-29 ENCOUNTER — Other Ambulatory Visit: Payer: Self-pay | Admitting: Internal Medicine

## 2018-10-29 DIAGNOSIS — M7989 Other specified soft tissue disorders: Secondary | ICD-10-CM

## 2018-10-30 ENCOUNTER — Ambulatory Visit
Admission: RE | Admit: 2018-10-30 | Discharge: 2018-10-30 | Disposition: A | Discharge status: Home / Self Care | Payer: Medicare Other | Source: Ambulatory Visit | Attending: Internal Medicine | Admitting: Internal Medicine

## 2018-10-30 DIAGNOSIS — M7989 Other specified soft tissue disorders: Secondary | ICD-10-CM

## 2019-01-30 ENCOUNTER — Telehealth: Payer: Self-pay | Admitting: Oncology

## 2019-01-30 NOTE — Telephone Encounter (Signed)
Per 4/15 schedule message 5/7 f/u to telephone visit. Lab/scan. Remain the same. Left message for patient.

## 2019-02-13 ENCOUNTER — Ambulatory Visit (HOSPITAL_COMMUNITY): Admission: RE | Admit: 2019-02-13 | Payer: Medicare Other | Source: Ambulatory Visit

## 2019-02-13 ENCOUNTER — Other Ambulatory Visit: Payer: Self-pay

## 2019-02-13 ENCOUNTER — Ambulatory Visit (HOSPITAL_COMMUNITY): Payer: Medicare Other

## 2019-02-13 ENCOUNTER — Ambulatory Visit (HOSPITAL_COMMUNITY)
Admission: RE | Admit: 2019-02-13 | Discharge: 2019-02-13 | Disposition: A | Discharge status: Home / Self Care | Payer: Medicare Other | Source: Ambulatory Visit | Attending: Oncology | Admitting: Oncology

## 2019-02-13 ENCOUNTER — Inpatient Hospital Stay: Payer: Medicare Other | Attending: Oncology

## 2019-02-13 DIAGNOSIS — C679 Malignant neoplasm of bladder, unspecified: Secondary | ICD-10-CM | POA: Insufficient documentation

## 2019-02-13 LAB — CBC WITH DIFFERENTIAL (CANCER CENTER ONLY)
Abs Immature Granulocytes: 0.05 10*3/uL (ref 0.00–0.07)
Basophils Absolute: 0 10*3/uL (ref 0.0–0.1)
Basophils Relative: 1 %
Eosinophils Absolute: 0.1 10*3/uL (ref 0.0–0.5)
Eosinophils Relative: 1 %
HCT: 40.2 % (ref 36.0–46.0)
Hemoglobin: 12.7 g/dL (ref 12.0–15.0)
Immature Granulocytes: 1 %
Lymphocytes Relative: 32 %
Lymphs Abs: 2.5 10*3/uL (ref 0.7–4.0)
MCH: 28.2 pg (ref 26.0–34.0)
MCHC: 31.6 g/dL (ref 30.0–36.0)
MCV: 89.1 fL (ref 80.0–100.0)
Monocytes Absolute: 0.6 10*3/uL (ref 0.1–1.0)
Monocytes Relative: 8 %
Neutro Abs: 4.4 10*3/uL (ref 1.7–7.7)
Neutrophils Relative %: 57 %
Platelet Count: 236 10*3/uL (ref 150–400)
RBC: 4.51 MIL/uL (ref 3.87–5.11)
RDW: 16.1 % — ABNORMAL HIGH (ref 11.5–15.5)
WBC Count: 7.7 10*3/uL (ref 4.0–10.5)
nRBC: 0 % (ref 0.0–0.2)

## 2019-02-13 LAB — CMP (CANCER CENTER ONLY)
ALT: 16 U/L (ref 0–44)
AST: 21 U/L (ref 15–41)
Albumin: 3.5 g/dL (ref 3.5–5.0)
Alkaline Phosphatase: 67 U/L (ref 38–126)
Anion gap: 11 (ref 5–15)
BUN: 28 mg/dL — ABNORMAL HIGH (ref 8–23)
CO2: 26 mmol/L (ref 22–32)
Calcium: 9 mg/dL (ref 8.9–10.3)
Chloride: 105 mmol/L (ref 98–111)
Creatinine: 1.34 mg/dL — ABNORMAL HIGH (ref 0.44–1.00)
GFR, Est AFR Am: 43 mL/min — ABNORMAL LOW (ref >60)
GFR, Estimated: 37 mL/min — ABNORMAL LOW (ref >60)
Glucose, Bld: 89 mg/dL (ref 70–99)
Potassium: 4.5 mmol/L (ref 3.5–5.1)
Sodium: 142 mmol/L (ref 135–145)
Total Bilirubin: 0.3 mg/dL (ref 0.3–1.2)
Total Protein: 7.1 g/dL (ref 6.5–8.1)

## 2019-02-20 ENCOUNTER — Inpatient Hospital Stay: Payer: Medicare Other | Attending: Oncology | Admitting: Oncology

## 2019-02-20 DIAGNOSIS — Z79899 Other long term (current) drug therapy: Secondary | ICD-10-CM | POA: Diagnosis not present

## 2019-02-20 DIAGNOSIS — Z7982 Long term (current) use of aspirin: Secondary | ICD-10-CM

## 2019-02-20 DIAGNOSIS — C679 Malignant neoplasm of bladder, unspecified: Secondary | ICD-10-CM | POA: Diagnosis not present

## 2019-02-20 NOTE — Progress Notes (Signed)
Hematology and Oncology Follow Up for Telemedicine Visits  Colleen Hughes [id number] 1936/05/10 83 y.o. 02/20/2019 12:31 PM [name], MDGriffin, [name], MD   I connected with Colleen Hughes on 02/20/19 at  1:15 PM EDT by telephone visit and verified that I am speaking with the correct person using two identifiers.   I discussed the limitations, risks, security and privacy concerns of performing an evaluation and management service by telemedicine and the availability of in-person appointments. I also discussed with the patient that there may be a patient responsible charge related to this service. The patient expressed understanding and agreed to proceed.  Other persons participating in the visit and their role in the encounter: None  [name]: Home Provider's location: Office    Principle Diagnosis: 83 year old woman with bladder cancer diagnosed in August 2019.  She presented with T4N1 urothelial carcinoma o after surgical resection.  Prior Therapy:  Robotic assisted laparoscopic cystectomy with ileal conduit formation.  She is also bilateral lymphadenectomy completed on July 24, 2018.  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  Current therapy: Active surveillance.   Interim History: Colleen Hughes reports no major changes or complaints in her health.  She continues to recover well from her operation and has resumed all activities of daily living.  She denies any abdominal pain or flank pain.  She reports her appetite continues to improve and back to baseline.  She denies any hematuria or dysuria.  She denies any issues related to her urostomy.     Medications: I have reviewed the patient's current medications.  Current Outpatient Medications  Medication Sig Dispense Refill  . acetaminophen (TYLENOL) 500 MG tablet Take 500-1,000 mg by mouth every 6 (six) hours as  needed for moderate pain or headache.     Marland Kitchen aspirin EC 81 MG tablet Take 81 mg by mouth daily.    . Calcium-Phosphorus-Vitamin D (CITRACAL +D3 PO) Take 2 tablets by mouth daily.    . Cholecalciferol (VITAMIN D3) 2000 units TABS Take 2,000 Units by mouth daily.    . Multiple Vitamin (MULTIVITAMIN WITH MINERALS) TABS tablet Take 1 tablet by mouth daily. [employer] Multivitamin    . traMADol (ULTRAM) 50 MG tablet Take 1-2 tablets (50-100 mg total) by mouth every 6 (six) hours as needed for moderate pain or severe pain. Post-operatively 20 tablet 0  . triamterene-hydrochlorothiazide (MAXZIDE-25) 37.5-25 MG tablet Take 1 tablet by mouth daily.     No current facility-administered medications for this visit.      Allergies:  Allergies  Allergen Reactions  . Codeine Nausea Only    Past Medical History, Surgical history, Social history, and Family History were reviewed and updated.     Lab Results: Lab Results  Component Value Date   WBC 7.7 02/13/2019   HGB 12.7 02/13/2019   HCT 40.2 02/13/2019   MCV 89.1 02/13/2019   PLT 236 02/13/2019     Chemistry      Component Value Date/Time   NA 142 02/13/2019 1109   K 4.5 02/13/2019 1109   CL 105 02/13/2019 1109   CO2 26 02/13/2019 1109   BUN 28 (H) 02/13/2019 1109   CREATININE 1.34 (H) 02/13/2019 1109      Component Value Date/Time   CALCIUM 9.0 02/13/2019 1109   ALKPHOS 67 02/13/2019 1109   AST 21 02/13/2019 1109   ALT 16 02/13/2019 1109   BILITOT 0.3 02/13/2019 1109  Radiological Studies:  EXAM: CT CHEST, ABDOMEN AND PELVIS WITHOUT CONTRAST  TECHNIQUE: Multidetector CT imaging of the chest, abdomen and pelvis was performed following the standard protocol without IV contrast.  COMPARISON:  10/18/2018  FINDINGS: CT CHEST FINDINGS  Cardiovascular: Coronary, aortic arch, and branch vessel atherosclerotic vascular disease.  Mediastinum/Nodes: No pathologic thoracic adenopathy is  identified.  Lungs/Pleura: Biapical pleuroparenchymal scarring. Stable right apical pleural nodule with associated calcification posteriorly. Calcified granuloma in the left upper lobe, image 43/4, stable. Mild bibasilar scarring.  Musculoskeletal: Healing fractures of the left lateral third, fourth, fifth, sixth, and seventh ribs. Old healed right lateral rib fractures. Small sclerotic lesion in the right eleventh rib appears stable. Healing left scapular fracture noted extending along the inferior portion of the scapula.  Bilateral breast implants noted. Thoracic kyphosis. Lower cervical spondylosis. Stable T11 superior endplate compression fracture.  CT ABDOMEN PELVIS FINDINGS  Hepatobiliary: Dependent density in the gallbladder potentially from sludge or gallstones. Stable small hepatic cysts.  Pancreas: Unremarkable  Spleen: Unremarkable  Adrenals/Urinary Tract: Both adrenal glands appear normal. Fluid density 1.9 by 1.8 cm left kidney upper pole hypodense lesion, likely a cyst. No discrete renal mass is identified. No mass lesion is observed along the course of the ureters or the ileal conduit. Obviously, sensitivity is reduced by the lack of IV contrast.  Stomach/Bowel: Sigmoid colon diverticulosis. Pelvic floor laxity with small bowel enterocele noted, for example image 69/6 and image 81/5.  Vascular/Lymphatic: Aortoiliac atherosclerotic vascular disease. Small periaortic lymph nodes are not pathologically enlarged.  Reproductive: The uterus is absent. There is some pelvic prolapse containing adipose tissue in bowel.  Other: No supplemental non-categorized findings.  Musculoskeletal: 8 mm of degenerative anterolisthesis at L5-S1 with resulting mild bilateral foraminal impingement. Multilevel degenerative facet arthropathy in the lumbar spine.  IMPRESSION: 1. No compelling findings of active malignancy. 2. Healing fractures of the left third  through seventh ribs and of the left scapula. 3. Other imaging findings of potential clinical significance: Athero coronary atherosclerosis. Sludge versus gallstones in the gallbladder. Chronic anterior wedging of the T11 vertebral body. Sigmoid colon diverticulosis. Pelvic floor laxity with small bowel enterocele noted. Prior cystectomy with ileal conduit. Bilateral foraminal impingement at L5-S1 primarily due to the 8 mm of degenerative anterolisthesis at this level. 4. Please note that today's noncontrast exam has reduced sensitivity compared to a contrast enhanced exam.  Impression and Plan:  83 year old woman with:  1.    Bladder cancer diagnosed in August 2019.  She was found to have T4N1 urothelial carcinoma.   She remains on active surveillance and chemotherapy has been deferred unless she develops progression of disease.  CT scan obtained on April 30 of 2020 showed no evidence of disease relapse.  The natural course of this disease and risk of relapse was assessed.  Different salvage therapies were also reviewed including systemic chemotherapy versus immunotherapy if she develops relapsed disease.  At this time of opted with active surveillance at this time.  She is agreeable with repeat imaging studies in 6 months.   2.  Renal insufficiency: Kidney function remains stable with creatinine clearance of less than 40 cc/min.   3.  Follow-up: We will be in 4 to 6 months with repeat imaging studies.  I discussed the assessment and treatment plan with the patient. The patient was provided an opportunity to ask questions and all were answered. The patient agreed with the plan and demonstrated an understanding of the instructions.   The patient was advised to call  back or seek an in-person evaluation if the symptoms worsen or if the condition fails to improve as anticipated.  I provided 20 minutes of non face-to-face telephone visit time during this encounter, and > 50% was spent  on reviewing laboratory data, CT scan as well as treatment options if needed in the future.  Zola Button, MD 02/20/2019 12:31 PM

## 2019-05-15 ENCOUNTER — Other Ambulatory Visit: Payer: Self-pay

## 2019-05-15 ENCOUNTER — Other Ambulatory Visit (HOSPITAL_COMMUNITY): Payer: Self-pay | Admitting: Internal Medicine

## 2019-05-15 ENCOUNTER — Ambulatory Visit (HOSPITAL_COMMUNITY)
Admission: RE | Admit: 2019-05-15 | Discharge: 2019-05-15 | Disposition: A | Discharge status: Home / Self Care | Payer: Medicare Other | Source: Ambulatory Visit | Attending: Internal Medicine | Admitting: Internal Medicine

## 2019-05-15 DIAGNOSIS — R609 Edema, unspecified: Secondary | ICD-10-CM

## 2019-05-15 NOTE — Progress Notes (Signed)
Lower extremity venous has been completed.   Preliminary results in CV Proc.   Abram Sander 05/15/2019 3:20 PM

## 2019-05-28 ENCOUNTER — Other Ambulatory Visit: Payer: Self-pay | Admitting: Internal Medicine

## 2019-05-28 ENCOUNTER — Ambulatory Visit
Admission: RE | Admit: 2019-05-28 | Discharge: 2019-05-28 | Disposition: A | Discharge status: Home / Self Care | Payer: Medicare Other | Source: Ambulatory Visit | Attending: Internal Medicine | Admitting: Internal Medicine

## 2019-05-28 DIAGNOSIS — Z8551 Personal history of malignant neoplasm of bladder: Secondary | ICD-10-CM

## 2019-05-28 DIAGNOSIS — R1909 Other intra-abdominal and pelvic swelling, mass and lump: Secondary | ICD-10-CM

## 2019-05-29 ENCOUNTER — Telehealth: Payer: Self-pay | Admitting: Oncology

## 2019-05-29 NOTE — Telephone Encounter (Signed)
Scheduled appt per 8/13 sch message- pt aware of appt date and time

## 2019-05-30 ENCOUNTER — Inpatient Hospital Stay: Payer: Medicare Other | Attending: Oncology | Admitting: Oncology

## 2019-05-30 DIAGNOSIS — Z79899 Other long term (current) drug therapy: Secondary | ICD-10-CM | POA: Insufficient documentation

## 2019-05-30 DIAGNOSIS — K5900 Constipation, unspecified: Secondary | ICD-10-CM | POA: Insufficient documentation

## 2019-05-30 DIAGNOSIS — R630 Anorexia: Secondary | ICD-10-CM | POA: Insufficient documentation

## 2019-05-30 DIAGNOSIS — N289 Disorder of kidney and ureter, unspecified: Secondary | ICD-10-CM | POA: Insufficient documentation

## 2019-05-30 DIAGNOSIS — D649 Anemia, unspecified: Secondary | ICD-10-CM | POA: Insufficient documentation

## 2019-05-30 DIAGNOSIS — Z5111 Encounter for antineoplastic chemotherapy: Secondary | ICD-10-CM | POA: Insufficient documentation

## 2019-05-30 DIAGNOSIS — Z7982 Long term (current) use of aspirin: Secondary | ICD-10-CM | POA: Insufficient documentation

## 2019-05-30 DIAGNOSIS — C679 Malignant neoplasm of bladder, unspecified: Secondary | ICD-10-CM | POA: Insufficient documentation

## 2019-06-02 ENCOUNTER — Telehealth: Payer: Self-pay

## 2019-06-02 NOTE — Telephone Encounter (Signed)
Spoke to patient's son [name] and made him aware that Dr. Alen Blew will do a phone visit with the patient at 3:00 pm  06/03/19. Verbalized understanding.

## 2019-06-02 NOTE — Telephone Encounter (Signed)
-----   Message from Wyatt Portela, MD sent at 06/02/2019  9:36 AM EDT ----- 8/18 at 3:00 pm. Thanks ----- Message ----- From: Tami Lin, RN Sent: 06/02/2019   8:44 AM EDT To: Wyatt Portela, MD  Patient's son called and stated patient did not feel well on Friday and that's why she missed her appointment. He is requesting to reschedule a phone visit. Is this ok and when would you like for this to occur. Lanelle Bal

## 2019-06-03 ENCOUNTER — Inpatient Hospital Stay (HOSPITAL_BASED_OUTPATIENT_CLINIC_OR_DEPARTMENT_OTHER): Payer: Medicare Other | Admitting: Oncology

## 2019-06-03 DIAGNOSIS — C679 Malignant neoplasm of bladder, unspecified: Secondary | ICD-10-CM | POA: Insufficient documentation

## 2019-06-03 DIAGNOSIS — K5900 Constipation, unspecified: Secondary | ICD-10-CM | POA: Diagnosis not present

## 2019-06-03 DIAGNOSIS — Z7189 Other specified counseling: Secondary | ICD-10-CM | POA: Insufficient documentation

## 2019-06-03 DIAGNOSIS — Z79899 Other long term (current) drug therapy: Secondary | ICD-10-CM | POA: Diagnosis not present

## 2019-06-03 DIAGNOSIS — R630 Anorexia: Secondary | ICD-10-CM | POA: Diagnosis not present

## 2019-06-03 DIAGNOSIS — Z7982 Long term (current) use of aspirin: Secondary | ICD-10-CM | POA: Diagnosis not present

## 2019-06-03 DIAGNOSIS — Z5111 Encounter for antineoplastic chemotherapy: Secondary | ICD-10-CM | POA: Diagnosis present

## 2019-06-03 DIAGNOSIS — D649 Anemia, unspecified: Secondary | ICD-10-CM | POA: Diagnosis not present

## 2019-06-03 DIAGNOSIS — N289 Disorder of kidney and ureter, unspecified: Secondary | ICD-10-CM | POA: Diagnosis not present

## 2019-06-03 MED ORDER — PROCHLORPERAZINE MALEATE 10 MG PO TABS: 10.0000 mg | ORAL_TABLET | Freq: Four times a day (QID) | ORAL | 0 refills | Status: AC | PRN

## 2019-06-03 NOTE — Progress Notes (Signed)
Hematology and Oncology Follow Up for Telemedicine Visits  Colleen Hughes [id number] June 29, 1936 83 y.o. 06/03/2019 2:23 PM Colleen Hughes, MDGriffin, Colleen Reichmann, MD   I connected with Ms. Ring on 06/03/19 at  3:00 PM EDT by video enabled telemedicine visit and verified that I am speaking with the correct person using two identifiers.   I discussed the limitations, risks, security and privacy concerns of performing an evaluation and management service by telemedicine and the availability of in-person appointments. I also discussed with the patient that there may be a patient responsible charge related to this service. The patient expressed understanding and agreed to proceed.  Other persons participating in the visit and their role in the encounter: Son Colleen Hughes  [name]: Home Provider's location: Office    Principle Diagnosis: 83 year old woman with bladder cancer diagnosed in 2019.  She initially was found to have T4N1 disease but subsequently developed stage IV disease in August 2020.   Prior Therapy:  Robotic assisted laparoscopic cystectomy with ileal conduit formation.  She is also bilateral lymphadenectomy completed on July 24, 2018.  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   Current therapy: Under evaluation for additional therapy.   Interim History: Ms. Colleen Hughes reports increased abdominal pain for the last few weeks.  She was evaluated by Dr. Laurann Montana and a CT scan of the abdomen and pelvis on 05/28/2019 showed a bilateral superior sacral insufficiency fractures related to a hypodense mass in the right pelvis measuring 3.7 x 3.1 cm.  Another mass was detected extending inferiorly from the right symphysis pubis into the medial right abductor muscle measuring 2.5 x 1.7 cm.  No lymphadenopathy or metastatic disease in the chest noted.  She has been prescribed oxycodone and  has helped her symptoms at this time and her mobility although still limited is improved slightly.  He denies any shortness of breath or difficulty breathing.  Her appetite has been reasonable.     Medications: Updated on review. Current Outpatient Medications  Medication Sig Dispense Refill  . acetaminophen (TYLENOL) 500 MG tablet Take 500-1,000 mg by mouth every 6 (six) hours as needed for moderate pain or headache.     Marland Kitchen aspirin EC 81 MG tablet Take 81 mg by mouth daily.    . Calcium-Phosphorus-Vitamin D (CITRACAL +D3 PO) Take 2 tablets by mouth daily.    . Cholecalciferol (VITAMIN D3) 2000 units TABS Take 2,000 Units by mouth daily.    . Multiple Vitamin (MULTIVITAMIN WITH MINERALS) TABS tablet Take 1 tablet by mouth daily. [employer] Multivitamin    . traMADol (ULTRAM) 50 MG tablet Take 1-2 tablets (50-100 mg total) by mouth every 6 (six) hours as needed for moderate pain or severe pain. Post-operatively 20 tablet 0  . triamterene-hydrochlorothiazide (MAXZIDE-25) 37.5-25 MG tablet Take 1 tablet by mouth daily.     No current facility-administered medications for this visit.      Allergies:  Allergies  Allergen Reactions  . Codeine Nausea Only    Past Medical History, Surgical history, Social history, and Family History updated without any changes.    Lab Results: Lab Results  Component Value Date   WBC 7.7 02/13/2019   HGB 12.7 02/13/2019   HCT 40.2 02/13/2019   MCV 89.1 02/13/2019   PLT 236 02/13/2019     Chemistry      Component Value Date/Time   NA 142 02/13/2019 1109   K 4.5 02/13/2019  1109   CL 105 02/13/2019 1109   CO2 26 02/13/2019 1109   BUN 28 (H) 02/13/2019 1109   CREATININE 1.34 (H) 02/13/2019 1109      Component Value Date/Time   CALCIUM 9.0 02/13/2019 1109   ALKPHOS 67 02/13/2019 1109   AST 21 02/13/2019 1109   ALT 16 02/13/2019 1109   BILITOT 0.3 02/13/2019 1109       Radiological Studies: IMPRESSION: 1. New comminuted fracture of  the right greater than left symphysis pubis with underlying lytic change, cannot exclude pathologic fracture with underlying bone metastasis in this location. Enlarging hypodense soft tissue mass extending superiorly into the anterior right pelvis from the right symphysis pubis, worrisome for associated soft tissue metastatic component. Similar tubular hypodense lesion extending inferiorly from the right symphysis pubis into the medial right adductor musculature, cannot exclude soft tissue metastasis. 2. New bilateral superior sacral ala insufficiency fractures without appreciable underlying lesions. 3. New mild superior T3 vertebral compression fracture without appreciable underlying lesion. 4. No lymphadenopathy or other potential findings of metastatic disease in the chest, abdomen or pelvis on this study limited by noncontrast technique. 5. Expected postsurgical changes from cystectomy with ileal conduit urinary diversion. No hydronephrosis.  Impression and Plan:  83 year old woman with:  1.    Bladder cancer diagnosed in August 2019.  She presented with T4N1 urothelial carcinoma  and subsequently developed stage IV disease based on imaging studies in August 2020 after presenting with abdominal pain.  The natural course of this disease as well as imaging studies were personally reviewed and discussed with the patient today.  It is undoubtably we are dealing with recurrent metastatic disease given her original pathology.  Treatment options at this time would include systemic therapy utilizing platinum based chemotherapy versus immunotherapy.  Other option would be palliative radiation therapy and supportive care only.  After discussion today, she is interested in getting aggressive treatments.  Systemic chemotherapy utilizing carboplatin and gemcitabine would be reasonable at this time.  Complication associated with chemotherapy include nausea, vomiting, myelosuppression, renal  insufficiency as well as infusion related complications.  She is agreeable to proceed after education class.  Peripheral veins will be used initially with a Port-A-Cath potentially be inserted later.   2.  Renal insufficiency: Kidney function remains stable although she has borderline kidney function and not a cisplatin candidate.  3.  Prognosis and goals of care: This disease is incurable and any therapy is palliative at this time.  Given her age and debilitation her prognosis is overall poor.  She  still desiring aggressive therapy for the time being.   4.  Follow-up: We will be in the immediate future to start anticancer treatment.   I discussed the assessment and treatment plan with the patient. The patient was provided an opportunity to ask questions and all were answered. The patient agreed with the plan and demonstrated an understanding of the instructions.   The patient was advised to call back or seek an in-person evaluation if the symptoms worsen or if the condition fails to improve as anticipated.  I provided 25 minutes of face-to-face video visit time during this encounter, and > 50% was dedicated to reviewing imaging studies, treatment options and long-term complications and prognosis.  Zola Button, MD 06/03/2019 2:23 PM

## 2019-06-03 NOTE — Progress Notes (Signed)
START ON PATHWAY REGIMEN - Bladder     A cycle is every 21 days:     Carboplatin      Gemcitabine   **Always confirm dose/schedule in your pharmacy ordering system**  Patient Characteristics: Advanced/Metastatic Disease, First Line, No Prior Platinum-Based Therapy, Poor Renal Function (CrCl < 50 mL/min), Unknown PD-L1 Expression Therapeutic Status: Advanced/Metastatic Disease Line of Therapy: First Line Prior Platinum-Based Therapy<= No Renal Function: Poor Renal Function (CrCl < 50 mL/min) PD-L1 Expression Status: Unknown PD-L1 Expression Intent of Therapy: Non-Curative / Palliative Intent, Discussed with Patient 

## 2019-06-04 ENCOUNTER — Telehealth: Payer: Self-pay | Admitting: Oncology

## 2019-06-04 NOTE — Telephone Encounter (Signed)
Called and spoke with patient. Confirmed 8/25 and 8/26 appt

## 2019-06-10 ENCOUNTER — Other Ambulatory Visit: Payer: Self-pay

## 2019-06-10 ENCOUNTER — Ambulatory Visit: Payer: Medicare Other

## 2019-06-10 ENCOUNTER — Inpatient Hospital Stay: Payer: Medicare Other

## 2019-06-10 ENCOUNTER — Inpatient Hospital Stay (HOSPITAL_BASED_OUTPATIENT_CLINIC_OR_DEPARTMENT_OTHER): Payer: Medicare Other | Admitting: Oncology

## 2019-06-10 VITALS — BP 126/76 | HR 100 | Temp 98.0°F | Resp 18 | Ht 66.0 in | Wt 91.6 lb

## 2019-06-10 DIAGNOSIS — Z5111 Encounter for antineoplastic chemotherapy: Secondary | ICD-10-CM | POA: Diagnosis not present

## 2019-06-10 DIAGNOSIS — C679 Malignant neoplasm of bladder, unspecified: Secondary | ICD-10-CM

## 2019-06-10 LAB — CMP (CANCER CENTER ONLY)
ALT: 9 U/L (ref 0–44)
AST: 13 U/L — ABNORMAL LOW (ref 15–41)
Albumin: 3.2 g/dL — ABNORMAL LOW (ref 3.5–5.0)
Alkaline Phosphatase: 133 U/L — ABNORMAL HIGH (ref 38–126)
Anion gap: 10 (ref 5–15)
BUN: 20 mg/dL (ref 8–23)
CO2: 25 mmol/L (ref 22–32)
Calcium: 9.7 mg/dL (ref 8.9–10.3)
Chloride: 100 mmol/L (ref 98–111)
Creatinine: 1.02 mg/dL — ABNORMAL HIGH (ref 0.44–1.00)
GFR, Est AFR Am: 60 mL/min — ABNORMAL LOW (ref >60)
GFR, Estimated: 52 mL/min — ABNORMAL LOW (ref >60)
Glucose, Bld: 112 mg/dL — ABNORMAL HIGH (ref 70–99)
Potassium: 4.1 mmol/L (ref 3.5–5.1)
Sodium: 135 mmol/L (ref 135–145)
Total Bilirubin: 0.6 mg/dL (ref 0.3–1.2)
Total Protein: 7.1 g/dL (ref 6.5–8.1)

## 2019-06-10 LAB — CBC WITH DIFFERENTIAL (CANCER CENTER ONLY)
Abs Immature Granulocytes: 0.16 10*3/uL — ABNORMAL HIGH (ref 0.00–0.07)
Basophils Absolute: 0.1 10*3/uL (ref 0.0–0.1)
Basophils Relative: 0 %
Eosinophils Absolute: 0.5 10*3/uL (ref 0.0–0.5)
Eosinophils Relative: 2 %
HCT: 33.8 % — ABNORMAL LOW (ref 36.0–46.0)
Hemoglobin: 10.4 g/dL — ABNORMAL LOW (ref 12.0–15.0)
Immature Granulocytes: 1 %
Lymphocytes Relative: 7 %
Lymphs Abs: 1.8 10*3/uL (ref 0.7–4.0)
MCH: 27.2 pg (ref 26.0–34.0)
MCHC: 30.8 g/dL (ref 30.0–36.0)
MCV: 88.5 fL (ref 80.0–100.0)
Monocytes Absolute: 1.3 10*3/uL — ABNORMAL HIGH (ref 0.1–1.0)
Monocytes Relative: 5 %
Neutro Abs: 22 10*3/uL — ABNORMAL HIGH (ref 1.7–7.7)
Neutrophils Relative %: 85 %
Platelet Count: 705 10*3/uL — ABNORMAL HIGH (ref 150–400)
RBC: 3.82 MIL/uL — ABNORMAL LOW (ref 3.87–5.11)
RDW: 16.5 % — ABNORMAL HIGH (ref 11.5–15.5)
WBC Count: 25.8 10*3/uL — ABNORMAL HIGH (ref 4.0–10.5)
nRBC: 0 % (ref 0.0–0.2)

## 2019-06-10 MED ORDER — MEGESTROL ACETATE 400 MG/10ML PO SUSP: 400.0000 mg | Freq: Two times a day (BID) | ORAL | 0 refills | Status: DC

## 2019-06-10 NOTE — Progress Notes (Signed)
Hematology and Oncology Follow Up Visit  Colleen Hughes [id number] November 04, 1935 83 y.o. 06/10/2019 9:09 AM Colleen Hughes, MDGriffin, Colleen Reichmann, MD   Principle Diagnosis: 83 year old woman with LAD cancer diagnosed in August 2019.  She was found to have T4N1 urothelial carcinoma and subsequently developed recurrent disease in August 2020 with pelvic masses.  Prior Therapy:  Robotic assisted laparoscopic cystectomy with ileal conduit formation.  She is also bilateral lymphadenectomy completed on July 24, 2018.  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  Current therapy: Under consideration to start systemic chemotherapy.  Interim History: Colleen Hughes is here for a follow-up visit.  Since her last visit, she is started developing pelvic discomfort and abdominal pain.  Imaging studies obtained on 05/28/2019 showed pelvic masses indicating relapsed disease.  She has been started on hydrocodone which she takes every 6-8 hours with improvement in her pain.  However, she has reported decline in her appetite and has lost close to 10 pounds.  She did report some constipation although she takes MiraLAX daily.  She denies any urinary difficulties or flank pain.  Her pelvic discomfort is manageable at this time.   She denied any alteration mental status, neuropathy, confusion or dizziness.  Denies any headaches or lethargy.  Denies any night sweats, weight loss or changes in appetite.  Denied orthopnea, dyspnea on exertion or chest discomfort.  Denies shortness of breath, difficulty breathing hemoptysis or cough.  Denies any abdominal distention, nausea, early satiety or dyspepsia.  Denies any hematuria, frequency, dysuria or nocturia.  Denies any skin irritation, dryness or rash.  Denies any ecchymosis or petechiae.  Denies any lymphadenopathy or clotting.  Denies any heat or cold intolerance.  Denies any anxiety or  depression.  Remaining review of system is negative.        Medications: Updated on review. Current Outpatient Medications  Medication Sig Dispense Refill  . acetaminophen (TYLENOL) 500 MG tablet Take 500-1,000 mg by mouth every 6 (six) hours as needed for moderate pain or headache.     Marland Kitchen aspirin EC 81 MG tablet Take 81 mg by mouth daily.    . Calcium-Phosphorus-Vitamin D (CITRACAL +D3 PO) Take 2 tablets by mouth daily.    . Cholecalciferol (VITAMIN D3) 2000 units TABS Take 2,000 Units by mouth daily.    . Multiple Vitamin (MULTIVITAMIN WITH MINERALS) TABS tablet Take 1 tablet by mouth daily. [facility] Multivitamin    . prochlorperazine (COMPAZINE) 10 MG tablet Take 1 tablet (10 mg total) by mouth every 6 (six) hours as needed for nausea or vomiting. 30 tablet 0  . traMADol (ULTRAM) 50 MG tablet Take 1-2 tablets (50-100 mg total) by mouth every 6 (six) hours as needed for moderate pain or severe pain. Post-operatively 20 tablet 0  . triamterene-hydrochlorothiazide (MAXZIDE-25) 37.5-25 MG tablet Take 1 tablet by mouth daily.     No current facility-administered medications for this visit.      Allergies:  Allergies  Allergen Reactions  . Codeine Nausea Only    Past Medical History, Surgical history, Social history, and Family History without any changes on review.    Physical Exam:  Blood pressure 126/76, pulse 100, temperature 98 F (36.7 C), temperature source Oral, resp. rate 18, height 5\' 6"  (1.676 m), weight 91 lb 9.6 oz (41.5 kg), SpO2 100 %.    ECOG: 1     General appearance: Alert, awake without any distress. Head: Atraumatic  without abnormalities Oropharynx: Without any thrush or ulcers. Eyes: No scleral icterus. Lymph nodes: No lymphadenopathy noted in the cervical, supraclavicular, or axillary nodes Heart:regular rate and rhythm, without any murmurs or gallops.   Lung: Clear to auscultation without any rhonchi, wheezes or dullness to  percussion. Abdomin: Soft, nontender without any shifting dullness or ascites. Musculoskeletal: No clubbing or cyanosis. Neurological: No motor or sensory deficits. Skin: No rashes or lesions. Psychiatric: Mood and affect appeared normal.      Lab Results: Lab Results  Component Value Date   WBC 7.7 02/13/2019   HGB 12.7 02/13/2019   HCT 40.2 02/13/2019   MCV 89.1 02/13/2019   PLT 236 02/13/2019     Chemistry      Component Value Date/Time   NA 142 02/13/2019 1109   K 4.5 02/13/2019 1109   CL 105 02/13/2019 1109   CO2 26 02/13/2019 1109   BUN 28 (H) 02/13/2019 1109   CREATININE 1.34 (H) 02/13/2019 1109      Component Value Date/Time   CALCIUM 9.0 02/13/2019 1109   ALKPHOS 67 02/13/2019 1109   AST 21 02/13/2019 1109   ALT 16 02/13/2019 1109   BILITOT 0.3 02/13/2019 1109       Impression and Plan:  83 year old woman with:  1.  Bladder cancer diagnosed in August 2019.  She was found to have T4N1 urothelial carcinoma and subsequently developed recurrent disease based on imaging studies in May 28, 2019.  The natural course of this disease was reviewed again and treatment options were reiterated.  Imaging studies from August 2020 were personally reviewed and shared with the patient today.   Palliative systemic chemotherapy versus immunotherapy versus supportive care were reiterated.  After discussion today, she is agreeable to proceed.  Complications related to chemotherapy clinic nausea, vomiting, myelosuppression and infusion related complications all reviewed today.   2.  Renal insufficiency: Creatinine will be repeated today and updated prior to start of systemic chemotherapy.  3.  Anemia: Related to previous surgery and malignancy.  Hemoglobin will be monitored on systemic chemotherapy.  4.  Anorexia: We have discussed strategies to boost her appetite including prescription for Megace will be sent to her.  5.  IV access: Risks and benefits of Port-A-Cath  insertion was discussed.  Complications including thrombosis, bleeding and infection were reiterated.  She is agreeable to proceed which will be inserted prior to cycle 2 of therapy.  6.  Antiemetics: Prescription for Compazine made available to her.  7.  Follow-up: In 3 weeks for evaluation prior to cycle 2 of therapy.  25  minutes was spent with the patient face-to-face today.  More than 50% of time was spent on updating her disease status, treatment options and answering questions regarding future plan of care.    Zola Button, MD 8/25/20209:09 AM

## 2019-06-10 NOTE — Addendum Note (Signed)
Addended by: Wyatt Portela on: 06/10/2019 10:11 AM   Modules accepted: Orders

## 2019-06-11 ENCOUNTER — Other Ambulatory Visit: Payer: Self-pay

## 2019-06-11 ENCOUNTER — Inpatient Hospital Stay: Payer: Medicare Other

## 2019-06-11 ENCOUNTER — Telehealth: Payer: Self-pay | Admitting: Oncology

## 2019-06-11 VITALS — BP 126/73 | HR 99 | Temp 97.8°F | Resp 16

## 2019-06-11 DIAGNOSIS — Z5111 Encounter for antineoplastic chemotherapy: Secondary | ICD-10-CM | POA: Diagnosis not present

## 2019-06-11 DIAGNOSIS — C678 Malignant neoplasm of overlapping sites of bladder: Secondary | ICD-10-CM

## 2019-06-11 MED ORDER — PALONOSETRON HCL INJECTION 0.25 MG/5ML
INTRAVENOUS | Status: AC
  Filled 2019-06-11: qty 5

## 2019-06-11 MED ORDER — SODIUM CHLORIDE 0.9 % IV SOLN
1400.0000 mg | Freq: Once | INTRAVENOUS | Status: AC
  Administered 2019-06-11: 1400 mg via INTRAVENOUS
  Filled 2019-06-11: qty 36.82

## 2019-06-11 MED ORDER — SODIUM CHLORIDE 0.9 % IV SOLN
270.0000 mg | Freq: Once | INTRAVENOUS | Status: AC
  Administered 2019-06-11: 270 mg via INTRAVENOUS
  Filled 2019-06-11: qty 27

## 2019-06-11 MED ORDER — SODIUM CHLORIDE 0.9 % IV SOLN
Freq: Once | INTRAVENOUS | Status: AC
  Administered 2019-06-11: 09:00:00 via INTRAVENOUS
  Filled 2019-06-11: qty 250

## 2019-06-11 MED ORDER — SODIUM CHLORIDE 0.9 % IV SOLN
Freq: Once | INTRAVENOUS | Status: AC
  Administered 2019-06-11: 10:00:00 via INTRAVENOUS
  Filled 2019-06-11: qty 250

## 2019-06-11 MED ORDER — SODIUM CHLORIDE 0.9 % IV SOLN
Freq: Once | INTRAVENOUS | Status: AC
  Administered 2019-06-11: 09:00:00 via INTRAVENOUS
  Filled 2019-06-11: qty 5

## 2019-06-11 MED ORDER — PALONOSETRON HCL INJECTION 0.25 MG/5ML
0.2500 mg | Freq: Once | INTRAVENOUS | Status: AC
  Administered 2019-06-11: 0.25 mg via INTRAVENOUS

## 2019-06-11 NOTE — Telephone Encounter (Signed)
Called and spoke with patient. Confirmed appt  °

## 2019-06-11 NOTE — Patient Instructions (Signed)
[facility] Discharge Instructions for Patients Receiving Chemotherapy  Today you received the following chemotherapy agents Gemzar; carboplatin  To help prevent nausea and vomiting after your treatment, we encourage you to take your nausea medication as directed. If you develop nausea and vomiting that is not controlled by your nausea medication, call the clinic.   BELOW ARE SYMPTOMS THAT SHOULD BE REPORTED IMMEDIATELY:  *FEVER GREATER THAN 100.5 F  *CHILLS WITH OR WITHOUT FEVER  NAUSEA AND VOMITING THAT IS NOT CONTROLLED WITH YOUR NAUSEA MEDICATION  *UNUSUAL SHORTNESS OF BREATH  *UNUSUAL BRUISING OR BLEEDING  TENDERNESS IN MOUTH AND THROAT WITH OR WITHOUT PRESENCE OF ULCERS  *URINARY PROBLEMS  *BOWEL PROBLEMS  UNUSUAL RASH Items with * indicate a potential emergency and should be followed up as soon as possible.  Feel free to call the clinic should you have any questions or concerns. The clinic phone number is [phone].  Please show the [facility] at check-in to the Emergency Department and triage nurse.  Gemcitabine injection What is this medicine? GEMCITABINE (jem SYE ta been) is a chemotherapy drug. This medicine is used to treat many types of cancer like breast cancer, lung cancer, pancreatic cancer, and ovarian cancer. This medicine may be used for other purposes; ask your health care provider or pharmacist if you have questions. COMMON BRAND NAME(S): Gemzar, Infugem What should I tell my health care provider before I take this medicine? They need to know if you have any of these conditions:  blood disorders  infection  kidney disease  liver disease  lung or breathing disease, like asthma  recent or ongoing radiation therapy  an unusual or allergic reaction to gemcitabine, other chemotherapy, other medicines, foods, dyes, or preservatives  pregnant or trying to get pregnant  breast-feeding How should I use this medicine? This  drug is given as an infusion into a vein. It is administered in a hospital or clinic by a specially trained health care professional. Talk to your pediatrician regarding the use of this medicine in children. Special care may be needed. Overdosage: If you think you have taken too much of this medicine contact a poison control center or emergency room at once. NOTE: This medicine is only for you. Do not share this medicine with others. What if I miss a dose? It is important not to miss your dose. Call your doctor or health care professional if you are unable to keep an appointment. What may interact with this medicine?  medicines to increase blood counts like filgrastim, pegfilgrastim, sargramostim  some other chemotherapy drugs like cisplatin  vaccines Talk to your doctor or health care professional before taking any of these medicines:  acetaminophen  aspirin  ibuprofen  ketoprofen  naproxen This list may not describe all possible interactions. Give your health care provider a list of all the medicines, herbs, non-prescription drugs, or dietary supplements you use. Also tell them if you smoke, drink alcohol, or use illegal drugs. Some items may interact with your medicine. What should I watch for while using this medicine? Visit your doctor for checks on your progress. This drug may make you feel generally unwell. This is not uncommon, as chemotherapy can affect healthy cells as well as cancer cells. Report any side effects. Continue your course of treatment even though you feel ill unless your doctor tells you to stop. In some cases, you may be given additional medicines to help with side effects. Follow all directions for their use. Call your  doctor or health care professional for advice if you get a fever, chills or sore throat, or other symptoms of a cold or flu. Do not treat yourself. This drug decreases your body's ability to fight infections. Try to avoid being around people who  are sick. This medicine may increase your risk to bruise or bleed. Call your doctor or health care professional if you notice any unusual bleeding. Be careful brushing and flossing your teeth or using a toothpick because you may get an infection or bleed more easily. If you have any dental work done, tell your dentist you are receiving this medicine. Avoid taking products that contain aspirin, acetaminophen, ibuprofen, naproxen, or ketoprofen unless instructed by your doctor. These medicines may hide a fever. Do not become pregnant while taking this medicine or for 6 months after stopping it. Women should inform their doctor if they wish to become pregnant or think they might be pregnant. Men should not father a child while taking this medicine and for 3 months after stopping it. There is a potential for serious side effects to an unborn child. Talk to your health care professional or pharmacist for more information. Do not breast-feed an infant while taking this medicine or for at least 1 week after stopping it. Men should inform their doctors if they wish to father a child. This medicine may lower sperm counts. Talk with your doctor or health care professional if you are concerned about your fertility. What side effects may I notice from receiving this medicine? Side effects that you should report to your doctor or health care professional as soon as possible:  allergic reactions like skin rash, itching or hives, swelling of the face, lips, or tongue  breathing problems  pain, redness, or irritation at site where injected  signs and symptoms of a dangerous change in heartbeat or heart rhythm like chest pain; dizziness; fast or irregular heartbeat; palpitations; feeling faint or lightheaded, falls; breathing problems  signs of decreased platelets or bleeding - bruising, pinpoint red spots on the skin, black, tarry stools, blood in the urine  signs of decreased red blood cells - unusually weak or  tired, feeling faint or lightheaded, falls  signs of infection - fever or chills, cough, sore throat, pain or difficulty passing urine  signs and symptoms of kidney injury like trouble passing urine or change in the amount of urine  signs and symptoms of liver injury like dark yellow or brown urine; general ill feeling or flu-like symptoms; light-colored stools; loss of appetite; nausea; right upper belly pain; unusually weak or tired; yellowing of the eyes or skin  swelling of ankles, feet, hands Side effects that usually do not require medical attention (report to your doctor or health care professional if they continue or are bothersome):  constipation  diarrhea  hair loss  loss of appetite  nausea  rash  vomiting This list may not describe all possible side effects. Call your doctor for medical advice about side effects. You may report side effects to FDA at 1-800-FDA-1088. Where should I keep my medicine? This drug is given in a hospital or clinic and will not be stored at home. NOTE: This sheet is a summary. It may not cover all possible information. If you have questions about this medicine, talk to your doctor, pharmacist, or health care provider.  2020 Elsevier/Gold Standard (2017-12-26 18:06:11)   Carboplatin injection What is this medicine? CARBOPLATIN (KAR boe pla tin) is a chemotherapy drug. It targets fast dividing cells,  like cancer cells, and causes these cells to die. This medicine is used to treat ovarian cancer and many other cancers. This medicine may be used for other purposes; ask your health care provider or pharmacist if you have questions. COMMON BRAND NAME(S): Paraplatin What should I tell my health care provider before I take this medicine? They need to know if you have any of these conditions:  blood disorders  hearing problems  kidney disease  recent or ongoing radiation therapy  an unusual or allergic reaction to carboplatin, cisplatin,  other chemotherapy, other medicines, foods, dyes, or preservatives  pregnant or trying to get pregnant  breast-feeding How should I use this medicine? This drug is usually given as an infusion into a vein. It is administered in a hospital or clinic by a specially trained health care professional. Talk to your pediatrician regarding the use of this medicine in children. Special care may be needed. Overdosage: If you think you have taken too much of this medicine contact a poison control center or emergency room at once. NOTE: This medicine is only for you. Do not share this medicine with others. What if I miss a dose? It is important not to miss a dose. Call your doctor or health care professional if you are unable to keep an appointment. What may interact with this medicine?  medicines for seizures  medicines to increase blood counts like filgrastim, pegfilgrastim, sargramostim  some antibiotics like amikacin, gentamicin, neomycin, streptomycin, tobramycin  vaccines Talk to your doctor or health care professional before taking any of these medicines:  acetaminophen  aspirin  ibuprofen  ketoprofen  naproxen This list may not describe all possible interactions. Give your health care provider a list of all the medicines, herbs, non-prescription drugs, or dietary supplements you use. Also tell them if you smoke, drink alcohol, or use illegal drugs. Some items may interact with your medicine. What should I watch for while using this medicine? Your condition will be monitored carefully while you are receiving this medicine. You will need important blood work done while you are taking this medicine. This drug may make you feel generally unwell. This is not uncommon, as chemotherapy can affect healthy cells as well as cancer cells. Report any side effects. Continue your course of treatment even though you feel ill unless your doctor tells you to stop. In some cases, you may be given  additional medicines to help with side effects. Follow all directions for their use. Call your doctor or health care professional for advice if you get a fever, chills or sore throat, or other symptoms of a cold or flu. Do not treat yourself. This drug decreases your body's ability to fight infections. Try to avoid being around people who are sick. This medicine may increase your risk to bruise or bleed. Call your doctor or health care professional if you notice any unusual bleeding. Be careful brushing and flossing your teeth or using a toothpick because you may get an infection or bleed more easily. If you have any dental work done, tell your dentist you are receiving this medicine. Avoid taking products that contain aspirin, acetaminophen, ibuprofen, naproxen, or ketoprofen unless instructed by your doctor. These medicines may hide a fever. Do not become pregnant while taking this medicine. Women should inform their doctor if they wish to become pregnant or think they might be pregnant. There is a potential for serious side effects to an unborn child. Talk to your health care professional or pharmacist for more  information. Do not breast-feed an infant while taking this medicine. What side effects may I notice from receiving this medicine? Side effects that you should report to your doctor or health care professional as soon as possible:  allergic reactions like skin rash, itching or hives, swelling of the face, lips, or tongue  signs of infection - fever or chills, cough, sore throat, pain or difficulty passing urine  signs of decreased platelets or bleeding - bruising, pinpoint red spots on the skin, black, tarry stools, nosebleeds  signs of decreased red blood cells - unusually weak or tired, fainting spells, lightheadedness  breathing problems  changes in hearing  changes in vision  chest pain  high blood pressure  low blood counts - This drug may decrease the number of white blood  cells, red blood cells and platelets. You may be at increased risk for infections and bleeding.  nausea and vomiting  pain, swelling, redness or irritation at the injection site  pain, tingling, numbness in the hands or feet  problems with balance, talking, walking  trouble passing urine or change in the amount of urine Side effects that usually do not require medical attention (report to your doctor or health care professional if they continue or are bothersome):  hair loss  loss of appetite  metallic taste in the mouth or changes in taste This list may not describe all possible side effects. Call your doctor for medical advice about side effects. You may report side effects to FDA at 1-800-FDA-1088. Where should I keep my medicine? This drug is given in a hospital or clinic and will not be stored at home. NOTE: This sheet is a summary. It may not cover all possible information. If you have questions about this medicine, talk to your doctor, pharmacist, or health care provider.  2020 Elsevier/Gold Standard (2008-01-07 14:38:05)

## 2019-06-13 ENCOUNTER — Other Ambulatory Visit: Payer: Self-pay | Admitting: *Deleted

## 2019-06-16 ENCOUNTER — Other Ambulatory Visit: Payer: Self-pay | Admitting: *Deleted

## 2019-06-16 ENCOUNTER — Telehealth: Payer: Self-pay | Admitting: *Deleted

## 2019-06-18 ENCOUNTER — Other Ambulatory Visit: Payer: Self-pay | Admitting: Physician Assistant

## 2019-06-19 ENCOUNTER — Other Ambulatory Visit: Payer: Self-pay | Admitting: Oncology

## 2019-06-19 ENCOUNTER — Other Ambulatory Visit: Payer: Self-pay

## 2019-06-19 ENCOUNTER — Encounter (HOSPITAL_COMMUNITY): Payer: Self-pay

## 2019-06-19 ENCOUNTER — Ambulatory Visit (HOSPITAL_COMMUNITY)
Admission: RE | Admit: 2019-06-19 | Discharge: 2019-06-19 | Disposition: A | Discharge status: Home / Self Care | Payer: Medicare Other | Source: Ambulatory Visit | Attending: Oncology | Admitting: Oncology

## 2019-06-19 DIAGNOSIS — M19042 Primary osteoarthritis, left hand: Secondary | ICD-10-CM | POA: Diagnosis not present

## 2019-06-19 DIAGNOSIS — M19041 Primary osteoarthritis, right hand: Secondary | ICD-10-CM | POA: Diagnosis not present

## 2019-06-19 DIAGNOSIS — Z79899 Other long term (current) drug therapy: Secondary | ICD-10-CM | POA: Insufficient documentation

## 2019-06-19 DIAGNOSIS — C679 Malignant neoplasm of bladder, unspecified: Secondary | ICD-10-CM

## 2019-06-19 DIAGNOSIS — Z885 Allergy status to narcotic agent status: Secondary | ICD-10-CM | POA: Insufficient documentation

## 2019-06-19 DIAGNOSIS — Z90711 Acquired absence of uterus with remaining cervical stump: Secondary | ICD-10-CM | POA: Insufficient documentation

## 2019-06-19 DIAGNOSIS — Z7982 Long term (current) use of aspirin: Secondary | ICD-10-CM | POA: Insufficient documentation

## 2019-06-19 HISTORY — DX: Neoplasm of unspecified behavior of unspecified site: D49.9

## 2019-06-19 HISTORY — PX: IR IMAGING GUIDED PORT INSERTION: IMG5740

## 2019-06-19 HISTORY — DX: Malignant (primary) neoplasm, unspecified: C80.1

## 2019-06-19 LAB — CBC
HCT: 32.5 % — ABNORMAL LOW (ref 36.0–46.0)
Hemoglobin: 9.9 g/dL — ABNORMAL LOW (ref 12.0–15.0)
MCH: 27.4 pg (ref 26.0–34.0)
MCHC: 30.5 g/dL (ref 30.0–36.0)
MCV: 90 fL (ref 80.0–100.0)
Platelets: 182 10*3/uL (ref 150–400)
RBC: 3.61 MIL/uL — ABNORMAL LOW (ref 3.87–5.11)
RDW: 15.5 % (ref 11.5–15.5)
WBC: 9.6 10*3/uL (ref 4.0–10.5)
nRBC: 0 % (ref 0.0–0.2)

## 2019-06-19 LAB — PROTIME-INR
INR: 1 (ref 0.8–1.2)
Prothrombin Time: 13.3 seconds (ref 11.4–15.2)

## 2019-06-19 LAB — APTT: aPTT: 27 seconds (ref 24–36)

## 2019-06-19 MED ORDER — CEFAZOLIN SODIUM-DEXTROSE 2-4 GM/100ML-% IV SOLN
INTRAVENOUS | Status: AC
  Administered 2019-06-19: 2 g via INTRAVENOUS
  Filled 2019-06-19: qty 100

## 2019-06-19 MED ORDER — FENTANYL CITRATE (PF) 100 MCG/2ML IJ SOLN
INTRAMUSCULAR | Status: AC | PRN
  Administered 2019-06-19 (×2): 25 ug via INTRAVENOUS

## 2019-06-19 MED ORDER — FENTANYL CITRATE (PF) 100 MCG/2ML IJ SOLN
INTRAMUSCULAR | Status: AC
  Filled 2019-06-19: qty 2

## 2019-06-19 MED ORDER — HEPARIN SOD (PORK) LOCK FLUSH 100 UNIT/ML IV SOLN
INTRAVENOUS | Status: AC
  Filled 2019-06-19: qty 5

## 2019-06-19 MED ORDER — LIDOCAINE HCL (PF) 1 % IJ SOLN
INTRAMUSCULAR | Status: AC | PRN
  Administered 2019-06-19: 5 mL

## 2019-06-19 MED ORDER — SODIUM CHLORIDE 0.9 % IV SOLN
INTRAVENOUS | Status: DC
  Administered 2019-06-19: 13:00:00 via INTRAVENOUS

## 2019-06-19 MED ORDER — CEFAZOLIN SODIUM-DEXTROSE 2-4 GM/100ML-% IV SOLN
2.0000 g | Freq: Once | INTRAVENOUS | Status: AC
  Administered 2019-06-19: 14:00:00 2 g via INTRAVENOUS

## 2019-06-19 MED ORDER — HEPARIN SOD (PORK) LOCK FLUSH 100 UNIT/ML IV SOLN
INTRAVENOUS | Status: AC | PRN
  Administered 2019-06-19: 500 [IU] via INTRAVENOUS

## 2019-06-19 MED ORDER — MIDAZOLAM HCL 2 MG/2ML IJ SOLN
INTRAMUSCULAR | Status: AC | PRN
  Administered 2019-06-19 (×2): 0.5 mg via INTRAVENOUS

## 2019-06-19 MED ORDER — LIDOCAINE-EPINEPHRINE (PF) 2 %-1:200000 IJ SOLN
INTRAMUSCULAR | Status: AC
  Filled 2019-06-19: qty 20

## 2019-06-19 MED ORDER — MIDAZOLAM HCL 2 MG/2ML IJ SOLN
INTRAMUSCULAR | Status: AC
  Filled 2019-06-19: qty 2

## 2019-06-19 NOTE — Discharge Instructions (Addendum)
Moderate Conscious Sedation, Adult, Care After These instructions provide you with information about caring for yourself after your procedure. Your health care provider may also give you more specific instructions. Your treatment has been planned according to current medical practices, but problems sometimes occur. Call your health care provider if you have any problems or questions after your procedure. What can I expect after the procedure? After your procedure, it is common:  To feel sleepy for several hours.  To feel clumsy and have poor balance for several hours.  To have poor judgment for several hours.  To vomit if you eat too soon. Follow these instructions at home: For at least 24 hours after the procedure:   Do not: ? Participate in activities where you could fall or become injured. ? Drive. ? Use heavy machinery. ? Drink alcohol. ? Take sleeping pills or medicines that cause drowsiness. ? Make important decisions or sign legal documents. ? Take care of children on your own.  Rest. Eating and drinking  Follow the diet recommended by your health care provider.  If you vomit: ? Drink water, juice, or soup when you can drink without vomiting. ? Make sure you have little or no nausea before eating solid foods. General instructions  Have a responsible adult stay with you until you are awake and alert.  Take over-the-counter and prescription medicines only as told by your health care provider.  If you smoke, do not smoke without supervision.  Keep all follow-up visits as told by your health care provider. This is important. Contact a health care provider if:  You keep feeling nauseous or you keep vomiting.  You feel light-headed.  You develop a rash.  You have a fever. Get help right away if:  You have trouble breathing. This information is not intended to replace advice given to you by your health care provider. Make sure you discuss any questions you have  with your health care provider. Document Released: 07/23/2013 Document Revised: 09/14/2017 Document Reviewed: 01/22/2016 Elsevier Patient Education  [place] Insertion, Care After This sheet gives you information about how to care for yourself after your procedure. Your health care provider may also give you more specific instructions. If you have problems or questions, contact your health care provider. What can I expect after the procedure? After the procedure, it is common to have:  Discomfort at the port insertion site.  Bruising on the skin over the port. This should improve over 3-4 days. Follow these instructions at home: [facility] care  After your port is placed, you will get a manufacturer's information card. The card has information about your port. Keep this card with you at all times.  Take care of the port as told by your health care provider. Ask your health care provider if you or a family member can get training for taking care of the port at home. A home health care nurse may also take care of the port.  Make sure to remember what type of port you have. Incision care  Follow instructions from your health care provider about how to take care of your port insertion site. Make sure you: ? Wash your hands with soap and water before and after you change your bandage (dressing). If soap and water are not available, use hand sanitizer. ? Change your dressing as told by your health care provider.  May remove dressing and bathe or shower in 24 to 48 hours.  Keep site clean  and dry and apply bandaid to replace initial dressing as needed. ? Leave stitches (sutures), skin glue, or adhesive strips in place. These skin closures may need to stay in place for 2 weeks or longer. If adhesive strip edges start to loosen and curl up, you may trim the loose edges. Do not remove adhesive strips completely unless your health care provider tells you to do that.  Check your  port insertion site every day for signs of infection. Check for: ? Redness, swelling, or pain. ? Fluid or blood. ? Warmth. ? Pus or a bad smell. Activity  Return to your normal activities as told by your health care provider. Ask your health care provider what activities are safe for you.  Do not lift anything that is heavier than 10 lb (4.5 kg), or the limit that you are told, until your health care provider says that it is safe. General instructions  Take over-the-counter and prescription medicines only as told by your health care provider.  Do not take baths, swim, or use a hot tub until your health care provider approves. Ask your health care provider if you may take showers. You may only be allowed to take sponge baths.  Do not drive for 24 hours if you were given a sedative during your procedure.  Wear a medical alert bracelet in case of an emergency. This will tell any health care providers that you have a port.  Keep all follow-up visits as told by your health care provider. This is important. Contact a health care provider if:  You cannot flush your port with saline as directed, or you cannot draw blood from the port.  You have a fever or chills.  You have redness, swelling, or pain around your port insertion site.  You have fluid or blood coming from your port insertion site.  Your port insertion site feels warm to the touch.  You have pus or a bad smell coming from the port insertion site. Get help right away if:  You have chest pain or shortness of breath.  You have bleeding from your port that you cannot control. Summary  Take care of the port as told by your health care provider. Keep the manufacturer's information card with you at all times.  Change your dressing as told by your health care provider.  Contact a health care provider if you have a fever or chills or if you have redness, swelling, or pain around your port insertion site.  Keep all follow-up  visits as told by your health care provider.  If ordered by your provider, may start use of EMLA cream in 2 weeks.  When not in use have port flushed every 4 to 6 weeks with heparin flush. This information is not intended to replace advice given to you by your health care provider. Make sure you discuss any questions you have with your health care provider. Document Released: 07/23/2013 Document Revised: 04/30/2018 Document Reviewed: 04/30/2018 Elsevier Patient Education  [place].

## 2019-06-19 NOTE — H&P (Signed)
Chief Complaint: Bladder cancer  Referring Physician(s): Wyatt Portela  Supervising Physician: Jacqulynn Cadet  Patient Status: [facility] - Out-pt  History of Present Illness: Colleen Hughes is a 83 y.o. female who is s/p robotic assisted laparoscopic cystectomy with ileal conduit formation to treat urothelial cancer.   She also had bilateral lymphadenectomy completed on July 24, 2018.    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  Imaging studies obtained on 05/28/2019 showed pelvic masses indicating relapsed disease.  She is here today for placement of a Port A cath.  She is NPO. No nausea/vomiting. No Fever/chills. ROS negative.  No blood thinners.  Past Medical History:  Diagnosis Date   Arthritis    hands    Past Surgical History:  Procedure Laterality Date   ABDOMINAL HYSTERECTOMY  [id number]   partial   basal cell skin cancer area removed  2014   left side of neck behind ear   BREAST ENHANCEMENT SURGERY Left mid 1980,s   COLONOSCOPY WITH PROPOFOL N/A 01/27/2014   Procedure: COLONOSCOPY WITH PROPOFOL;  Surgeon: Garlan Fair, MD;  Location: [facility] ENDOSCOPY;  Service: Endoscopy;  Laterality: N/A;   CYSTOSCOPY N/A 07/24/2018   Procedure: CYSTOSCOPY WITH INDOCYANINE GREEN DYE;  Surgeon: Alexis Frock, MD;  Location: [facility] ORS;  Service: Urology;  Laterality: N/A;   LYMPHADENECTOMY Bilateral 07/24/2018   Procedure: LYMPHADENECTOMY;  Surgeon: Alexis Frock, MD;  Location: [facility] OR[facility];  Service: Urology;  Laterality: Bilateral;   ROBOT ASSISTED LAPAROSCOPIC COMPLETE CYSTECT ILEAL CONDUIT N/A 07/24/2018   Procedure: XI ROBOTIC ASSISTED LAPAROSCOPIC COMPLETE CYSTECT ILEAL CONDUIT;  Surgeon: Alexis Frock, MD;  Location: [facility];  Service: Urology;  Laterality: N/A;   TRANSURETHRAL RESECTION OF BLADDER TUMOR N/A 06/03/2018   Procedure: TRANSURETHRAL RESECTION OF  BLADDER TUMOR (TURBT);  Surgeon: Festus Aloe, MD;  Location: [facility];  Service: Urology;  Laterality: N/A;  ONLY NEEDS 90 MIN FOR ALL PROCEDURES    Allergies: Codeine  Medications: Prior to Admission medications   Medication Sig Start Date End Date Taking? Authorizing Provider  acetaminophen (TYLENOL) 500 MG tablet Take 500-1,000 mg by mouth every 6 (six) hours as needed for moderate pain or headache.     [provider]  aspirin EC 81 MG tablet Take 81 mg by mouth daily.    [provider]  Calcium-Phosphorus-Vitamin D (CITRACAL +D3 PO) Take 2 tablets by mouth daily.    [provider]  Cholecalciferol (VITAMIN D3) 2000 units TABS Take 2,000 Units by mouth daily.    [provider]  megestrol (MEGACE) 400 MG/10ML suspension Take 10 mLs (400 mg total) by mouth 2 (two) times daily. 06/10/19   Wyatt Portela, MD  Multiple Vitamin (MULTIVITAMIN WITH MINERALS) TABS tablet Take 1 tablet by mouth daily. [employer] Multivitamin    [provider]  prochlorperazine (COMPAZINE) 10 MG tablet Take 1 tablet (10 mg total) by mouth every 6 (six) hours as needed for nausea or vomiting. 06/03/19   Wyatt Portela, MD  traMADol (ULTRAM) 50 MG tablet Take 1-2 tablets (50-100 mg total) by mouth every 6 (six) hours as needed for moderate pain or severe pain. Post-operatively 07/30/18   Alexis Frock, MD  triamterene-hydrochlorothiazide (MAXZIDE-25) 37.5-25 MG tablet Take 1 tablet by mouth daily.    [provider]     History reviewed. No pertinent family history.  Social History   Socioeconomic History  Marital status: Married    Spouse name: Not on file   Number of children: Not on file   Years of education: Not on file   Highest education level: Not on file  Occupational History   Not on file  Social Needs   Financial resource strain: Not on file   Food insecurity    Worry: Not on file    Inability: Not on file    Transportation needs    Medical: Not on file    Non-medical: Not on file  Tobacco Use   Smoking status: Never Smoker   Smokeless tobacco: Never Used  Substance and Sexual Activity   Alcohol use: Yes    Comment: rare beer   Drug use: Never   Sexual activity: Not on file  Lifestyle   Physical activity    Days per week: Not on file    Minutes per session: Not on file   Stress: Not on file  Relationships   Social connections    Talks on phone: Not on file    Gets together: Not on file    Attends religious service: Not on file    Active member of club or organization: Not on file    Attends meetings of clubs or organizations: Not on file    Relationship status: Not on file  Other Topics Concern   Not on file  Social History Narrative   Not on file     Review of Systems: A 12 point ROS discussed and pertinent positives are indicated in the HPI above.  All other systems are negative.  Review of Systems  Vital Signs: BP 139/83 (BP Location: Right Arm)    Pulse 95    Temp 98.3 F (36.8 C) (Oral)    Resp 18    SpO2 100%   Physical Exam Vitals signs reviewed.  Constitutional:      Comments: Very thin  HENT:     Head: Atraumatic.  Eyes:     Extraocular Movements: Extraocular movements intact.  Neck:     Musculoskeletal: Normal range of motion.  Cardiovascular:     Rate and Rhythm: Normal rate and regular rhythm.  Pulmonary:     Effort: Pulmonary effort is normal.     Breath sounds: Normal breath sounds.  Abdominal:     General: There is no distension.     Palpations: Abdomen is soft.  Musculoskeletal: Normal range of motion.  Skin:    General: Skin is warm and dry.  Neurological:     General: No focal deficit present.     Mental Status: She is alert and oriented to person, place, and time.  Psychiatric:        Mood and Affect: Mood normal.        Behavior: Behavior normal.        Thought Content: Thought content normal.        Judgment: Judgment  normal.     Imaging: Ct Abdomen Pelvis Wo Contrast  Result Date: 05/28/2019 CLINICAL DATA:  Bladder cancer status post cystectomy. Restaging. Reported right groin mass for 1 week. EXAM: CT CHEST, ABDOMEN AND PELVIS WITHOUT CONTRAST TECHNIQUE: Multidetector CT imaging of the chest, abdomen and pelvis was performed following the standard protocol without IV contrast. COMPARISON:  02/13/2019 CT chest, abdomen and pelvis. FINDINGS: CT CHEST FINDINGS Cardiovascular: Normal heart size. No significant pericardial effusion/thickening. Atherosclerotic nonaneurysmal thoracic aorta. Normal caliber pulmonary arteries. Mediastinum/Nodes: No discrete thyroid nodules. Unremarkable esophagus. No pathologically enlarged axillary, mediastinal or hilar  lymph nodes, noting limited sensitivity for the detection of hilar adenopathy on this noncontrast study. Lungs/Pleura: No pneumothorax. No pleural effusion. Patchy reticulonodular opacities at both lung apices, partially calcified, unchanged, compatible with postinfectious scarring. No acute consolidative airspace disease, lung masses or new significant pulmonary nodules. Stable tiny calcified peripheral left lower lobe granuloma. Musculoskeletal: No aggressive appearing focal osseous lesions. Healed deformities throughout several ribs bilaterally. Intact appearing bilateral breast prostheses. Exaggerated thoracic kyphosis. Mild superior T3 vertebral compression fracture is new since 02/13/2019. Moderate chronic T11 vertebral compression fracture is stable. Mild thoracic spondylosis. CT ABDOMEN PELVIS FINDINGS Hepatobiliary: Simple scattered right liver cysts, largest 1.7 cm. Subcentimeter hypodense right liver lesion is too small to characterize and is unchanged, presumably benign. No new liver lesions. Normal gallbladder with no radiopaque cholelithiasis. No biliary ductal dilatation. Pancreas: Normal, with no mass or duct dilation. Spleen: Normal size. No mass.  Adrenals/Urinary Tract: Normal adrenals. No hydronephrosis. No renal stones. Simple 2.0 cm upper left renal cyst. No additional contour deforming renal lesions. Status post cystectomy with ventral right abdominal wall ileal conduit urinary diversion. Ileal conduit is collapsed and grossly normal. Stomach/Bowel: Normal non-distended stomach. Stable postsurgical changes from pelvic small bowel resection. No small bowel dilatation or wall thickening. Appendix not discretely visualized. Moderate sigmoid diverticulosis, with no large bowel wall thickening or acute pericolonic fat stranding. Vascular/Lymphatic: Atherosclerotic nonaneurysmal abdominal aorta. No pathologically enlarged lymph nodes in the abdomen or pelvis. Reproductive: Status post hysterectomy, with no abnormal findings at the vaginal cuff. No adnexal mass. Other: No pneumoperitoneum or ascites. Musculoskeletal: There are new bilateral superior sacral ala insufficiency fractures without significant displacement (series 2/image 90). There is a new comminuted fracture of right greater than left symphysis pubis with underlying lytic change. A hypodense 3.7 x 3.1 cm anterior right pelvic soft tissue mass abuts the superior margin of the right symphysis pubis fracture (series 2/ image 103), increased from 2.1 x 2.0 x 8.9 cm on 02/13/2019 CT. There is a tubular hypodense lesion extending inferiorly from the right symphysis pubis into the medial right adductor muscle measuring 2.5 x 1.7 cm (series 2/image 123), new. Marked lower lumbar facet arthropathy. IMPRESSION: 1. New comminuted fracture of the right greater than left symphysis pubis with underlying lytic change, cannot exclude pathologic fracture with underlying bone metastasis in this location. Enlarging hypodense soft tissue mass extending superiorly into the anterior right pelvis from the right symphysis pubis, worrisome for associated soft tissue metastatic component. Similar tubular hypodense lesion  extending inferiorly from the right symphysis pubis into the medial right adductor musculature, cannot exclude soft tissue metastasis. 2. New bilateral superior sacral ala insufficiency fractures without appreciable underlying lesions. 3. New mild superior T3 vertebral compression fracture without appreciable underlying lesion. 4. No lymphadenopathy or other potential findings of metastatic disease in the chest, abdomen or pelvis on this study limited by noncontrast technique. 5. Expected postsurgical changes from cystectomy with ileal conduit urinary diversion. No hydronephrosis. 6.  Aortic Atherosclerosis (ICD10-I70.0). Electronically Signed   By: Ilona Sorrel M.D.   On: 05/28/2019 13:26   Ct Chest Wo Contrast  Result Date: 05/28/2019 CLINICAL DATA:  Bladder cancer status post cystectomy. Restaging. Reported right groin mass for 1 week. EXAM: CT CHEST, ABDOMEN AND PELVIS WITHOUT CONTRAST TECHNIQUE: Multidetector CT imaging of the chest, abdomen and pelvis was performed following the standard protocol without IV contrast. COMPARISON:  02/13/2019 CT chest, abdomen and pelvis. FINDINGS: CT CHEST FINDINGS Cardiovascular: Normal heart size. No significant pericardial effusion/thickening. Atherosclerotic nonaneurysmal thoracic aorta.  Normal caliber pulmonary arteries. Mediastinum/Nodes: No discrete thyroid nodules. Unremarkable esophagus. No pathologically enlarged axillary, mediastinal or hilar lymph nodes, noting limited sensitivity for the detection of hilar adenopathy on this noncontrast study. Lungs/Pleura: No pneumothorax. No pleural effusion. Patchy reticulonodular opacities at both lung apices, partially calcified, unchanged, compatible with postinfectious scarring. No acute consolidative airspace disease, lung masses or new significant pulmonary nodules. Stable tiny calcified peripheral left lower lobe granuloma. Musculoskeletal: No aggressive appearing focal osseous lesions. Healed deformities throughout  several ribs bilaterally. Intact appearing bilateral breast prostheses. Exaggerated thoracic kyphosis. Mild superior T3 vertebral compression fracture is new since 02/13/2019. Moderate chronic T11 vertebral compression fracture is stable. Mild thoracic spondylosis. CT ABDOMEN PELVIS FINDINGS Hepatobiliary: Simple scattered right liver cysts, largest 1.7 cm. Subcentimeter hypodense right liver lesion is too small to characterize and is unchanged, presumably benign. No new liver lesions. Normal gallbladder with no radiopaque cholelithiasis. No biliary ductal dilatation. Pancreas: Normal, with no mass or duct dilation. Spleen: Normal size. No mass. Adrenals/Urinary Tract: Normal adrenals. No hydronephrosis. No renal stones. Simple 2.0 cm upper left renal cyst. No additional contour deforming renal lesions. Status post cystectomy with ventral right abdominal wall ileal conduit urinary diversion. Ileal conduit is collapsed and grossly normal. Stomach/Bowel: Normal non-distended stomach. Stable postsurgical changes from pelvic small bowel resection. No small bowel dilatation or wall thickening. Appendix not discretely visualized. Moderate sigmoid diverticulosis, with no large bowel wall thickening or acute pericolonic fat stranding. Vascular/Lymphatic: Atherosclerotic nonaneurysmal abdominal aorta. No pathologically enlarged lymph nodes in the abdomen or pelvis. Reproductive: Status post hysterectomy, with no abnormal findings at the vaginal cuff. No adnexal mass. Other: No pneumoperitoneum or ascites. Musculoskeletal: There are new bilateral superior sacral ala insufficiency fractures without significant displacement (series 2/image 90). There is a new comminuted fracture of right greater than left symphysis pubis with underlying lytic change. A hypodense 3.7 x 3.1 cm anterior right pelvic soft tissue mass abuts the superior margin of the right symphysis pubis fracture (series 2/ image 103), increased from 2.1 x 2.0 x  8.9 cm on 02/13/2019 CT. There is a tubular hypodense lesion extending inferiorly from the right symphysis pubis into the medial right adductor muscle measuring 2.5 x 1.7 cm (series 2/image 123), new. Marked lower lumbar facet arthropathy. IMPRESSION: 1. New comminuted fracture of the right greater than left symphysis pubis with underlying lytic change, cannot exclude pathologic fracture with underlying bone metastasis in this location. Enlarging hypodense soft tissue mass extending superiorly into the anterior right pelvis from the right symphysis pubis, worrisome for associated soft tissue metastatic component. Similar tubular hypodense lesion extending inferiorly from the right symphysis pubis into the medial right adductor musculature, cannot exclude soft tissue metastasis. 2. New bilateral superior sacral ala insufficiency fractures without appreciable underlying lesions. 3. New mild superior T3 vertebral compression fracture without appreciable underlying lesion. 4. No lymphadenopathy or other potential findings of metastatic disease in the chest, abdomen or pelvis on this study limited by noncontrast technique. 5. Expected postsurgical changes from cystectomy with ileal conduit urinary diversion. No hydronephrosis. 6.  Aortic Atherosclerosis (ICD10-I70.0). Electronically Signed   By: Ilona Sorrel M.D.   On: 05/28/2019 13:26    Labs:  CBC: Recent Labs    09/17/18 0849 10/18/18 1150 02/13/19 1109 06/10/19 0859  WBC 11.4* 9.4 7.7 25.8*  HGB 8.8* 11.4* 12.7 10.4*  HCT 29.5* 37.6 40.2 33.8*  PLT 429* 281 236 705*    COAGS: No results for input(s): INR, APTT in the last 8760 hours.  BMP: Recent Labs    09/17/18 0849 10/18/18 1150 02/13/19 1109 06/10/19 0859  NA 141 139 142 135  K 4.6 4.4 4.5 4.1  CL 109 104 105 100  CO2 22 27 26 25   GLUCOSE 89 91 89 112*  BUN 37* 26* 28* 20  CALCIUM 9.1 9.3 9.0 9.7  CREATININE 1.43* 1.37* 1.34* 1.02*  GFRNONAA 35* 36* 37* 52*  GFRAA 40* 42* 43*  60*    LIVER FUNCTION TESTS: Recent Labs    09/17/18 0849 10/18/18 1150 02/13/19 1109 06/10/19 0859  BILITOT 0.2* 0.4 0.3 0.6  AST 11* 18 21 13*  ALT 6 10 16 9   ALKPHOS 46 51 67 133*  PROT 6.8 7.1 7.1 7.1  ALBUMIN 3.2* 3.7 3.5 3.2*    TUMOR MARKERS: No results for input(s): AFPTM, CEA, CA199, CHROMGRNA in the last 8760 hours.  Assessment and Plan:  Recurrent urothelial cancer.  Will proceed with placement of a Port A Cath today by Dr. Laurence Ferrari.  Risks and benefits of image guided port-a-catheter placement was discussed with the patient including, but not limited to bleeding, infection, pneumothorax, or fibrin sheath development and need for additional procedures.  All of the patient's questions were answered, patient is agreeable to proceed. Consent signed and in chart.  Thank you for this interesting consult.  I greatly enjoyed meeting Colleen Hughes and look forward to participating in their care.  A copy of this report was sent to the requesting provider on this date.  Electronically Signed: Murrell Redden, PA-C   06/19/2019, 12:42 PM      I spent a total of  30 Minutes  in face to face in clinical consultation, greater than 50% of which was counseling/coordinating care for [facility] A Cath.

## 2019-06-19 NOTE — Procedures (Signed)
Interventional Radiology Procedure Note  Procedure: Placement of a right IJ approach single lumen PowerPort.  Tip is positioned at the superior cavoatrial junction and catheter is ready for immediate use.  Complications: No immediate Recommendations:  - Ok to shower tomorrow - Do not submerge for 7 days - Routine line care   Signed,  Heath K. McCullough, MD   

## 2019-06-23 ENCOUNTER — Other Ambulatory Visit: Payer: Self-pay | Admitting: Oncology

## 2019-07-01 ENCOUNTER — Inpatient Hospital Stay: Payer: Medicare Other

## 2019-07-01 ENCOUNTER — Inpatient Hospital Stay: Payer: Medicare Other | Attending: Oncology | Admitting: Oncology

## 2019-07-01 ENCOUNTER — Other Ambulatory Visit: Payer: Self-pay

## 2019-07-01 VITALS — BP 137/77 | HR 89 | Temp 98.2°F | Resp 18 | Ht 66.0 in | Wt 97.1 lb

## 2019-07-01 DIAGNOSIS — Z5111 Encounter for antineoplastic chemotherapy: Secondary | ICD-10-CM | POA: Insufficient documentation

## 2019-07-01 DIAGNOSIS — Z95828 Presence of other vascular implants and grafts: Secondary | ICD-10-CM

## 2019-07-01 DIAGNOSIS — C678 Malignant neoplasm of overlapping sites of bladder: Secondary | ICD-10-CM | POA: Diagnosis not present

## 2019-07-01 DIAGNOSIS — C679 Malignant neoplasm of bladder, unspecified: Secondary | ICD-10-CM | POA: Diagnosis present

## 2019-07-01 DIAGNOSIS — C9000 Multiple myeloma not having achieved remission: Secondary | ICD-10-CM

## 2019-07-01 DIAGNOSIS — Z23 Encounter for immunization: Secondary | ICD-10-CM | POA: Insufficient documentation

## 2019-07-01 LAB — CBC WITH DIFFERENTIAL (CANCER CENTER ONLY)
Abs Immature Granulocytes: 1.76 10*3/uL — ABNORMAL HIGH (ref 0.00–0.07)
Basophils Absolute: 0.1 10*3/uL (ref 0.0–0.1)
Basophils Relative: 1 %
Eosinophils Absolute: 0 10*3/uL (ref 0.0–0.5)
Eosinophils Relative: 0 %
HCT: 26.9 % — ABNORMAL LOW (ref 36.0–46.0)
Hemoglobin: 8.4 g/dL — ABNORMAL LOW (ref 12.0–15.0)
Immature Granulocytes: 14 %
Lymphocytes Relative: 31 %
Lymphs Abs: 3.7 10*3/uL (ref 0.7–4.0)
MCH: 27.9 pg (ref 26.0–34.0)
MCHC: 31.2 g/dL (ref 30.0–36.0)
MCV: 89.4 fL (ref 80.0–100.0)
Monocytes Absolute: 1.4 10*3/uL — ABNORMAL HIGH (ref 0.1–1.0)
Monocytes Relative: 11 %
Neutro Abs: 5.3 10*3/uL (ref 1.7–7.7)
Neutrophils Relative %: 43 %
Platelet Count: 625 10*3/uL — ABNORMAL HIGH (ref 150–400)
RBC: 3.01 MIL/uL — ABNORMAL LOW (ref 3.87–5.11)
RDW: 17 % — ABNORMAL HIGH (ref 11.5–15.5)
WBC Count: 12.2 10*3/uL — ABNORMAL HIGH (ref 4.0–10.5)
nRBC: 0 % (ref 0.0–0.2)

## 2019-07-01 LAB — CMP (CANCER CENTER ONLY)
ALT: 15 U/L (ref 0–44)
AST: 18 U/L (ref 15–41)
Albumin: 2.9 g/dL — ABNORMAL LOW (ref 3.5–5.0)
Alkaline Phosphatase: 69 U/L (ref 38–126)
Anion gap: 8 (ref 5–15)
BUN: 29 mg/dL — ABNORMAL HIGH (ref 8–23)
CO2: 21 mmol/L — ABNORMAL LOW (ref 22–32)
Calcium: 8.7 mg/dL — ABNORMAL LOW (ref 8.9–10.3)
Chloride: 112 mmol/L — ABNORMAL HIGH (ref 98–111)
Creatinine: 1.03 mg/dL — ABNORMAL HIGH (ref 0.44–1.00)
GFR, Est AFR Am: 59 mL/min — ABNORMAL LOW (ref >60)
GFR, Estimated: 51 mL/min — ABNORMAL LOW (ref >60)
Glucose, Bld: 92 mg/dL (ref 70–99)
Potassium: 4.9 mmol/L (ref 3.5–5.1)
Sodium: 141 mmol/L (ref 135–145)
Total Bilirubin: <0.2 mg/dL — ABNORMAL LOW (ref 0.3–1.2)
Total Protein: 6.9 g/dL (ref 6.5–8.1)

## 2019-07-01 MED ORDER — PALONOSETRON HCL INJECTION 0.25 MG/5ML
INTRAVENOUS | Status: AC
  Filled 2019-07-01: qty 5

## 2019-07-01 MED ORDER — SODIUM CHLORIDE 0.9 % IV SOLN
Freq: Once | INTRAVENOUS | Status: AC
  Administered 2019-07-01: 10:00:00 via INTRAVENOUS
  Filled 2019-07-01: qty 5

## 2019-07-01 MED ORDER — SODIUM CHLORIDE 0.9 % IV SOLN
270.0000 mg | Freq: Once | INTRAVENOUS | Status: AC
  Administered 2019-07-01: 270 mg via INTRAVENOUS
  Filled 2019-07-01: qty 27

## 2019-07-01 MED ORDER — SODIUM CHLORIDE 0.9% FLUSH
10.0000 mL | INTRAVENOUS | Status: DC | PRN
  Administered 2019-07-01: 09:00:00 10 mL via INTRAVENOUS
  Filled 2019-07-01: qty 10

## 2019-07-01 MED ORDER — SODIUM CHLORIDE 0.9 % IV SOLN
Freq: Once | INTRAVENOUS | Status: AC
  Administered 2019-07-01: 10:00:00 via INTRAVENOUS
  Filled 2019-07-01: qty 250

## 2019-07-01 MED ORDER — SODIUM CHLORIDE 0.9% FLUSH
10.0000 mL | INTRAVENOUS | Status: DC | PRN
  Administered 2019-07-01: 10 mL
  Filled 2019-07-01: qty 10

## 2019-07-01 MED ORDER — HEPARIN SOD (PORK) LOCK FLUSH 100 UNIT/ML IV SOLN
500.0000 [IU] | Freq: Once | INTRAVENOUS | Status: AC | PRN
  Administered 2019-07-01: 500 [IU]
  Filled 2019-07-01: qty 5

## 2019-07-01 MED ORDER — PALONOSETRON HCL INJECTION 0.25 MG/5ML
0.2500 mg | Freq: Once | INTRAVENOUS | Status: AC
  Administered 2019-07-01: 0.25 mg via INTRAVENOUS

## 2019-07-01 MED ORDER — INFLUENZA VAC A&B SA ADJ QUAD 0.5 ML IM PRSY
0.5000 mL | PREFILLED_SYRINGE | Freq: Once | INTRAMUSCULAR | Status: AC
  Administered 2019-07-01: 0.5 mL via INTRAMUSCULAR

## 2019-07-01 MED ORDER — INFLUENZA VAC A&B SA ADJ QUAD 0.5 ML IM PRSY
PREFILLED_SYRINGE | INTRAMUSCULAR | Status: AC
  Filled 2019-07-01: qty 0.5

## 2019-07-01 MED ORDER — SODIUM CHLORIDE 0.9 % IV SOLN
1000.0000 mg/m2 | Freq: Once | INTRAVENOUS | Status: AC
  Administered 2019-07-01: 1482 mg via INTRAVENOUS
  Filled 2019-07-01: qty 38.98

## 2019-07-01 NOTE — Progress Notes (Signed)
Hematology and Oncology Follow Up Visit  Colleen Hughes [id number] 18-Jun-1936 83 y.o. 07/01/2019 9:44 AM Colleen Hughes, MDGriffin, Colleen Reichmann, MD   Principle Diagnosis: 83 year old woman with T4N1 urothelial carcinoma of the bladder diagnosed in 2019.  She developed stage IV disease with pelvic masses in August 2020.    Prior Therapy:  Robotic assisted laparoscopic cystectomy with ileal conduit formation.  She is also bilateral lymphadenectomy completed on July 24, 2018.  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  Current therapy: Carboplatin and gemcitabine started on 06/11/2019.  She is here for cycle 2 of therapy.  Interim History: [name] returns today for a repeat evaluation.  Since the last visit, she received the first cycle of chemotherapy without any major complications.  She denies nausea, vomiting or infusion related complications.  She denies any hospitalization or illnesses.  Her pelvic discomfort continues to improve at this time and she is only taking 1 dose of pain medication last 24 hours.  Her appetite continues to improve and she has gained 5 pounds using Megace.  Her mobility is improving slightly at this time.  Patient denied headaches, blurry vision, syncope or seizures.  Denies any fevers, chills or sweats.  Denied chest pain, palpitation, orthopnea or leg edema.  Denied cough, wheezing or hemoptysis.  Denied nausea, vomiting or abdominal pain.  Denies any constipation or diarrhea.  Denies any frequency urgency or hesitancy.  Denies any arthralgias or myalgias.  Denies any skin rashes or lesions.  Denies any bleeding or clotting tendency.  Denies any easy bruising.  Denies any hair or nail changes.  Denies any anxiety or depression.  Remaining review of system is negative.         Medications: Without any changes on review. Current Outpatient Medications  Medication  Sig Dispense Refill  . acetaminophen (TYLENOL) 500 MG tablet Take 500-1,000 mg by mouth every 6 (six) hours as needed for moderate pain or headache.     Marland Kitchen aspirin EC 81 MG tablet Take 81 mg by mouth daily.    . Calcium-Phosphorus-Vitamin D (CITRACAL +D3 PO) Take 2 tablets by mouth daily.    . Cholecalciferol (VITAMIN D3) 2000 units TABS Take 2,000 Units by mouth daily.    . megestrol (MEGACE) 40 MG/ML suspension TAKE 10 MLS (400 MG TOTAL) BY MOUTH 2 (TWO) TIMES DAILY. 240 mL 0  . Multiple Vitamin (MULTIVITAMIN WITH MINERALS) TABS tablet Take 1 tablet by mouth daily. [employer] [facility] Multivitamin    . prochlorperazine (COMPAZINE) 10 MG tablet Take 1 tablet (10 mg total) by mouth every 6 (six) hours as needed for nausea or vomiting. 30 tablet 0  . traMADol (ULTRAM) 50 MG tablet Take 1-2 tablets (50-100 mg total) by mouth every 6 (six) hours as needed for moderate pain or severe pain. Post-operatively 20 tablet 0  . triamterene-hydrochlorothiazide (MAXZIDE-25) 37.5-25 MG tablet Take 1 tablet by mouth daily.     No current facility-administered medications for this visit.      Allergies:  Allergies  Allergen Reactions  . Codeine Nausea Only    Past Medical History, Surgical history, Social history, and Family History updated on review.    Physical Exam:  Blood pressure 137/77, pulse 89, temperature 98.2 F (36.8 C), temperature source Oral, resp. rate 18, height 5\' 6"  (1.676 m), weight 97 lb 1.6 oz (44 kg), SpO2 100 %.    ECOG: 1     General  appearance: Comfortable appearing without any discomfort Head: Normocephalic without any trauma Oropharynx: Mucous membranes are moist and pink without any thrush or ulcers. Eyes: Pupils are equal and round reactive to light. Lymph nodes: No cervical, supraclavicular, inguinal or axillary lymphadenopathy.   Heart:regular rate and rhythm.  S1 and S2 without leg edema. Lung: Clear without any rhonchi or wheezes.  No dullness to  percussion. Abdomin: Soft, nontender, nondistended with good bowel sounds.  No hepatosplenomegaly. Musculoskeletal: No joint deformity or effusion.  Full range of motion noted. Neurological: No deficits noted on motor, sensory and deep tendon reflex exam. Skin: No petechial rash or dryness.  Appeared moist.        Lab Results: Lab Results  Component Value Date   WBC 12.2 (H) 07/01/2019   HGB 8.4 (L) 07/01/2019   HCT 26.9 (L) 07/01/2019   MCV 89.4 07/01/2019   PLT 625 (H) 07/01/2019     Chemistry      Component Value Date/Time   NA 135 06/10/2019 0859   K 4.1 06/10/2019 0859   CL 100 06/10/2019 0859   CO2 25 06/10/2019 0859   BUN 20 06/10/2019 0859   CREATININE 1.02 (H) 06/10/2019 0859      Component Value Date/Time   CALCIUM 9.7 06/10/2019 0859   ALKPHOS 133 (H) 06/10/2019 0859   AST 13 (L) 06/10/2019 0859   ALT 9 06/10/2019 0859   BILITOT 0.6 06/10/2019 0859       Impression and Plan:  83 year old woman with:  1.  Stage IV bladder cancer documented in August 2020 with pelvic masses.  She was initially diagnosed with T4N1 disease in 2019.  She is currently receiving palliative chemotherapy and has tolerated the first cycle without any complaints.  Risks and benefits of continuing this regimen was reviewed today.  Potential complications include cytopenias, nausea, fatigue among others were reviewed.  She is experiencing clinical benefit at this time and I have recommended continuing this regimen.  The goal is to complete 6 cycles of therapy with potential interim scan after 3 cycles.   2.  Renal insufficiency: Kidney function has improved with a creatinine clearance of close to 50 cc/min.  3.  Anemia: Hemoglobin has drifted slightly.  This is related to chemotherapy and malignancy.  No transfusion is needed at this time.  4.  Anorexia: Appetite has improved at this time gained more weight.  We continue to address this issue and discuss strategies to boost her  appetite.  5.  IV access: Port-A-Cath remains in place and accessed without any difficulties.  6.  Antiemetics: No nausea or vomiting reported at this time.  7.  Follow-up: In 3 weeks for the next cycle of therapy.  25  minutes was spent with the patient face-to-face today.  More than 50% of time was dedicated to reviewing her disease status, treatment options and complications of therapy.    Zola Button, MD 9/15/20209:44 AM

## 2019-07-01 NOTE — Patient Instructions (Signed)
[facility] Discharge Instructions for Patients Receiving Chemotherapy  Today you received the following chemotherapy agents Gemzar; carboplatin  To help prevent nausea and vomiting after your treatment, we encourage you to take your nausea medication as directed. If you develop nausea and vomiting that is not controlled by your nausea medication, call the clinic.   BELOW ARE SYMPTOMS THAT SHOULD BE REPORTED IMMEDIATELY:  *FEVER GREATER THAN 100.5 F  *CHILLS WITH OR WITHOUT FEVER  NAUSEA AND VOMITING THAT IS NOT CONTROLLED WITH YOUR NAUSEA MEDICATION  *UNUSUAL SHORTNESS OF BREATH  *UNUSUAL BRUISING OR BLEEDING  TENDERNESS IN MOUTH AND THROAT WITH OR WITHOUT PRESENCE OF ULCERS  *URINARY PROBLEMS  *BOWEL PROBLEMS  UNUSUAL RASH Items with * indicate a potential emergency and should be followed up as soon as possible.  Feel free to call the clinic should you have any questions or concerns. The clinic phone number is [phone].  Please show the [facility] at check-in to the Emergency Department and triage nurse.  Gemcitabine injection What is this medicine? GEMCITABINE (jem SYE ta been) is a chemotherapy drug. This medicine is used to treat many types of cancer like breast cancer, lung cancer, pancreatic cancer, and ovarian cancer. This medicine may be used for other purposes; ask your health care provider or pharmacist if you have questions. COMMON BRAND NAME(S): Gemzar, Infugem What should I tell my health care provider before I take this medicine? They need to know if you have any of these conditions:  blood disorders  infection  kidney disease  liver disease  lung or breathing disease, like asthma  recent or ongoing radiation therapy  an unusual or allergic reaction to gemcitabine, other chemotherapy, other medicines, foods, dyes, or preservatives  pregnant or trying to get pregnant  breast-feeding How should I use this medicine? This  drug is given as an infusion into a vein. It is administered in a hospital or clinic by a specially trained health care professional. Talk to your pediatrician regarding the use of this medicine in children. Special care may be needed. Overdosage: If you think you have taken too much of this medicine contact a poison control center or emergency room at once. NOTE: This medicine is only for you. Do not share this medicine with others. What if I miss a dose? It is important not to miss your dose. Call your doctor or health care professional if you are unable to keep an appointment. What may interact with this medicine?  medicines to increase blood counts like filgrastim, pegfilgrastim, sargramostim  some other chemotherapy drugs like cisplatin  vaccines Talk to your doctor or health care professional before taking any of these medicines:  acetaminophen  aspirin  ibuprofen  ketoprofen  naproxen This list may not describe all possible interactions. Give your health care provider a list of all the medicines, herbs, non-prescription drugs, or dietary supplements you use. Also tell them if you smoke, drink alcohol, or use illegal drugs. Some items may interact with your medicine. What should I watch for while using this medicine? Visit your doctor for checks on your progress. This drug may make you feel generally unwell. This is not uncommon, as chemotherapy can affect healthy cells as well as cancer cells. Report any side effects. Continue your course of treatment even though you feel ill unless your doctor tells you to stop. In some cases, you may be given additional medicines to help with side effects. Follow all directions for their use. Call your  doctor or health care professional for advice if you get a fever, chills or sore throat, or other symptoms of a cold or flu. Do not treat yourself. This drug decreases your body's ability to fight infections. Try to avoid being around people who  are sick. This medicine may increase your risk to bruise or bleed. Call your doctor or health care professional if you notice any unusual bleeding. Be careful brushing and flossing your teeth or using a toothpick because you may get an infection or bleed more easily. If you have any dental work done, tell your dentist you are receiving this medicine. Avoid taking products that contain aspirin, acetaminophen, ibuprofen, naproxen, or ketoprofen unless instructed by your doctor. These medicines may hide a fever. Do not become pregnant while taking this medicine or for 6 months after stopping it. Women should inform their doctor if they wish to become pregnant or think they might be pregnant. Men should not father a child while taking this medicine and for 3 months after stopping it. There is a potential for serious side effects to an unborn child. Talk to your health care professional or pharmacist for more information. Do not breast-feed an infant while taking this medicine or for at least 1 week after stopping it. Men should inform their doctors if they wish to father a child. This medicine may lower sperm counts. Talk with your doctor or health care professional if you are concerned about your fertility. What side effects may I notice from receiving this medicine? Side effects that you should report to your doctor or health care professional as soon as possible:  allergic reactions like skin rash, itching or hives, swelling of the face, lips, or tongue  breathing problems  pain, redness, or irritation at site where injected  signs and symptoms of a dangerous change in heartbeat or heart rhythm like chest pain; dizziness; fast or irregular heartbeat; palpitations; feeling faint or lightheaded, falls; breathing problems  signs of decreased platelets or bleeding - bruising, pinpoint red spots on the skin, black, tarry stools, blood in the urine  signs of decreased red blood cells - unusually weak or  tired, feeling faint or lightheaded, falls  signs of infection - fever or chills, cough, sore throat, pain or difficulty passing urine  signs and symptoms of kidney injury like trouble passing urine or change in the amount of urine  signs and symptoms of liver injury like dark yellow or brown urine; general ill feeling or flu-like symptoms; light-colored stools; loss of appetite; nausea; right upper belly pain; unusually weak or tired; yellowing of the eyes or skin  swelling of ankles, feet, hands Side effects that usually do not require medical attention (report to your doctor or health care professional if they continue or are bothersome):  constipation  diarrhea  hair loss  loss of appetite  nausea  rash  vomiting This list may not describe all possible side effects. Call your doctor for medical advice about side effects. You may report side effects to FDA at 1-800-FDA-1088. Where should I keep my medicine? This drug is given in a hospital or clinic and will not be stored at home. NOTE: This sheet is a summary. It may not cover all possible information. If you have questions about this medicine, talk to your doctor, pharmacist, or health care provider.  2020 Elsevier/Gold Standard (2017-12-26 18:06:11)   Carboplatin injection What is this medicine? CARBOPLATIN (KAR boe pla tin) is a chemotherapy drug. It targets fast dividing cells,  like cancer cells, and causes these cells to die. This medicine is used to treat ovarian cancer and many other cancers. This medicine may be used for other purposes; ask your health care provider or pharmacist if you have questions. COMMON BRAND NAME(S): Paraplatin What should I tell my health care provider before I take this medicine? They need to know if you have any of these conditions:  blood disorders  hearing problems  kidney disease  recent or ongoing radiation therapy  an unusual or allergic reaction to carboplatin, cisplatin,  other chemotherapy, other medicines, foods, dyes, or preservatives  pregnant or trying to get pregnant  breast-feeding How should I use this medicine? This drug is usually given as an infusion into a vein. It is administered in a hospital or clinic by a specially trained health care professional. Talk to your pediatrician regarding the use of this medicine in children. Special care may be needed. Overdosage: If you think you have taken too much of this medicine contact a poison control center or emergency room at once. NOTE: This medicine is only for you. Do not share this medicine with others. What if I miss a dose? It is important not to miss a dose. Call your doctor or health care professional if you are unable to keep an appointment. What may interact with this medicine?  medicines for seizures  medicines to increase blood counts like filgrastim, pegfilgrastim, sargramostim  some antibiotics like amikacin, gentamicin, neomycin, streptomycin, tobramycin  vaccines Talk to your doctor or health care professional before taking any of these medicines:  acetaminophen  aspirin  ibuprofen  ketoprofen  naproxen This list may not describe all possible interactions. Give your health care provider a list of all the medicines, herbs, non-prescription drugs, or dietary supplements you use. Also tell them if you smoke, drink alcohol, or use illegal drugs. Some items may interact with your medicine. What should I watch for while using this medicine? Your condition will be monitored carefully while you are receiving this medicine. You will need important blood work done while you are taking this medicine. This drug may make you feel generally unwell. This is not uncommon, as chemotherapy can affect healthy cells as well as cancer cells. Report any side effects. Continue your course of treatment even though you feel ill unless your doctor tells you to stop. In some cases, you may be given  additional medicines to help with side effects. Follow all directions for their use. Call your doctor or health care professional for advice if you get a fever, chills or sore throat, or other symptoms of a cold or flu. Do not treat yourself. This drug decreases your body's ability to fight infections. Try to avoid being around people who are sick. This medicine may increase your risk to bruise or bleed. Call your doctor or health care professional if you notice any unusual bleeding. Be careful brushing and flossing your teeth or using a toothpick because you may get an infection or bleed more easily. If you have any dental work done, tell your dentist you are receiving this medicine. Avoid taking products that contain aspirin, acetaminophen, ibuprofen, naproxen, or ketoprofen unless instructed by your doctor. These medicines may hide a fever. Do not become pregnant while taking this medicine. Women should inform their doctor if they wish to become pregnant or think they might be pregnant. There is a potential for serious side effects to an unborn child. Talk to your health care professional or pharmacist for more  information. Do not breast-feed an infant while taking this medicine. What side effects may I notice from receiving this medicine? Side effects that you should report to your doctor or health care professional as soon as possible:  allergic reactions like skin rash, itching or hives, swelling of the face, lips, or tongue  signs of infection - fever or chills, cough, sore throat, pain or difficulty passing urine  signs of decreased platelets or bleeding - bruising, pinpoint red spots on the skin, black, tarry stools, nosebleeds  signs of decreased red blood cells - unusually weak or tired, fainting spells, lightheadedness  breathing problems  changes in hearing  changes in vision  chest pain  high blood pressure  low blood counts - This drug may decrease the number of white blood  cells, red blood cells and platelets. You may be at increased risk for infections and bleeding.  nausea and vomiting  pain, swelling, redness or irritation at the injection site  pain, tingling, numbness in the hands or feet  problems with balance, talking, walking  trouble passing urine or change in the amount of urine Side effects that usually do not require medical attention (report to your doctor or health care professional if they continue or are bothersome):  hair loss  loss of appetite  metallic taste in the mouth or changes in taste This list may not describe all possible side effects. Call your doctor for medical advice about side effects. You may report side effects to FDA at 1-800-FDA-1088. Where should I keep my medicine? This drug is given in a hospital or clinic and will not be stored at home. NOTE: This sheet is a summary. It may not cover all possible information. If you have questions about this medicine, talk to your doctor, pharmacist, or health care provider.  2020 Elsevier/Gold Standard (2008-01-07 14:38:05)

## 2019-07-01 NOTE — Progress Notes (Signed)
Nutrition Assessment   Reason for Assessment:  Patient identified on Malnutrition Screening report for weight loss and poor appetite   ASSESSMENT:  83 year old female with urothelial carcinoma of the bladder diagnosed in 2019.  Patient developed stage IV disease with pelvic masses in August 2020. Past medical history of lap cystectomy with ileal conduit formation, arthritis.    Spoke with patient during infusion today.  Patient eating cheese stick and crackers.  Patient reports that medication has helped increase her appetite.  Patient is eating 3 meals per day and 2 snacks.  Reports breakfast is usually cheese toast, eggs, sometimes bacon, coffee.  Lunch is meat sandwich and chips. Dinner is meat and vegetables.  Reports that son and daughter in law cook for her and help her prepare meals. Reports that she drinks water, tea, milk, lemonade     Nutrition Focused Physical Exam: deferred   Medications: calcium, vit D, MVI, compazine   Labs: creatinine 1.03   Anthropometrics:   Height: 66 inches Weight: 97 lb 1.6 oz today, increased from 91 lb on 8/25 Noted weight in Jan 2020 110 lb BMI: 14  Patient reports weight loss occurred after surgery.  12% weight loss in the last 8 months   Estimated Energy Needs  Kcals: 1320-1500 Protein: 66-75 g Fluid: 1.5 L   NUTRITION DIAGNOSIS: Inadequate oral intake related to cancer and cancer related treatments as evidenced by 12% weight loss in the last 8 months.     INTERVENTION:  Congratulated patient on weight gain.  Discussed additional ways to add calories and protein to current diet pattern.   Continue appetite stimulant. Contact information provided   MONITORING, EVALUATION, GOAL: Patient will consume adequate calories to promote weight gain.   Next Visit: Wednesday, Oct 7 during infusion  Floyd Wade B. Zenia Resides, [place], [place] Registered Dietitian [phone] (pager)

## 2019-07-03 ENCOUNTER — Other Ambulatory Visit: Payer: Self-pay | Admitting: Oncology

## 2019-07-03 ENCOUNTER — Telehealth: Payer: Self-pay | Admitting: Oncology

## 2019-07-03 NOTE — Telephone Encounter (Signed)
Called and spoke with patient. Confirmed appt  °

## 2019-07-15 ENCOUNTER — Other Ambulatory Visit: Payer: Self-pay | Admitting: Oncology

## 2019-07-23 ENCOUNTER — Inpatient Hospital Stay (HOSPITAL_BASED_OUTPATIENT_CLINIC_OR_DEPARTMENT_OTHER): Payer: Medicare Other | Admitting: Oncology

## 2019-07-23 ENCOUNTER — Inpatient Hospital Stay: Payer: Medicare Other

## 2019-07-23 ENCOUNTER — Other Ambulatory Visit: Payer: Self-pay

## 2019-07-23 ENCOUNTER — Inpatient Hospital Stay: Payer: Medicare Other | Attending: Oncology

## 2019-07-23 VITALS — BP 140/79 | HR 97 | Temp 98.7°F | Resp 18 | Ht 66.0 in | Wt 105.4 lb

## 2019-07-23 DIAGNOSIS — C678 Malignant neoplasm of overlapping sites of bladder: Secondary | ICD-10-CM

## 2019-07-23 DIAGNOSIS — C9000 Multiple myeloma not having achieved remission: Secondary | ICD-10-CM | POA: Diagnosis not present

## 2019-07-23 DIAGNOSIS — C679 Malignant neoplasm of bladder, unspecified: Secondary | ICD-10-CM | POA: Diagnosis present

## 2019-07-23 DIAGNOSIS — Z5111 Encounter for antineoplastic chemotherapy: Secondary | ICD-10-CM | POA: Insufficient documentation

## 2019-07-23 DIAGNOSIS — Z95828 Presence of other vascular implants and grafts: Secondary | ICD-10-CM

## 2019-07-23 DIAGNOSIS — C7989 Secondary malignant neoplasm of other specified sites: Secondary | ICD-10-CM | POA: Diagnosis not present

## 2019-07-23 LAB — CMP (CANCER CENTER ONLY)
ALT: 6 U/L (ref 0–44)
AST: 13 U/L — ABNORMAL LOW (ref 15–41)
Albumin: 3.2 g/dL — ABNORMAL LOW (ref 3.5–5.0)
Alkaline Phosphatase: 60 U/L (ref 38–126)
Anion gap: 10 (ref 5–15)
BUN: 35 mg/dL — ABNORMAL HIGH (ref 8–23)
CO2: 18 mmol/L — ABNORMAL LOW (ref 22–32)
Calcium: 8.5 mg/dL — ABNORMAL LOW (ref 8.9–10.3)
Chloride: 113 mmol/L — ABNORMAL HIGH (ref 98–111)
Creatinine: 1.22 mg/dL — ABNORMAL HIGH (ref 0.44–1.00)
GFR, Est AFR Am: 48 mL/min — ABNORMAL LOW (ref >60)
GFR, Estimated: 42 mL/min — ABNORMAL LOW (ref >60)
Glucose, Bld: 101 mg/dL — ABNORMAL HIGH (ref 70–99)
Potassium: 4.4 mmol/L (ref 3.5–5.1)
Sodium: 141 mmol/L (ref 135–145)
Total Bilirubin: 0.2 mg/dL — ABNORMAL LOW (ref 0.3–1.2)
Total Protein: 6.7 g/dL (ref 6.5–8.1)

## 2019-07-23 LAB — CBC WITH DIFFERENTIAL (CANCER CENTER ONLY)
Abs Immature Granulocytes: 0.82 10*3/uL — ABNORMAL HIGH (ref 0.00–0.07)
Basophils Absolute: 0.1 10*3/uL (ref 0.0–0.1)
Basophils Relative: 1 %
Eosinophils Absolute: 0 10*3/uL (ref 0.0–0.5)
Eosinophils Relative: 0 %
HCT: 29.6 % — ABNORMAL LOW (ref 36.0–46.0)
Hemoglobin: 8.9 g/dL — ABNORMAL LOW (ref 12.0–15.0)
Immature Granulocytes: 9 %
Lymphocytes Relative: 36 %
Lymphs Abs: 3.5 10*3/uL (ref 0.7–4.0)
MCH: 29.6 pg (ref 26.0–34.0)
MCHC: 30.1 g/dL (ref 30.0–36.0)
MCV: 98.3 fL (ref 80.0–100.0)
Monocytes Absolute: 1.1 10*3/uL — ABNORMAL HIGH (ref 0.1–1.0)
Monocytes Relative: 12 %
Neutro Abs: 4.1 10*3/uL (ref 1.7–7.7)
Neutrophils Relative %: 42 %
Platelet Count: 292 10*3/uL (ref 150–400)
RBC: 3.01 MIL/uL — ABNORMAL LOW (ref 3.87–5.11)
RDW: 23.8 % — ABNORMAL HIGH (ref 11.5–15.5)
WBC Count: 9.7 10*3/uL (ref 4.0–10.5)
nRBC: 0 % (ref 0.0–0.2)

## 2019-07-23 MED ORDER — PALONOSETRON HCL INJECTION 0.25 MG/5ML
0.2500 mg | Freq: Once | INTRAVENOUS | Status: AC
  Administered 2019-07-23: 09:00:00 0.25 mg via INTRAVENOUS

## 2019-07-23 MED ORDER — SODIUM CHLORIDE 0.9 % IV SOLN: 350.0000 mg | Freq: Once | INTRAVENOUS | Status: DC

## 2019-07-23 MED ORDER — HEPARIN SOD (PORK) LOCK FLUSH 100 UNIT/ML IV SOLN
500.0000 [IU] | Freq: Once | INTRAVENOUS | Status: AC | PRN
  Administered 2019-07-23: 12:00:00 500 [IU]
  Filled 2019-07-23: qty 5

## 2019-07-23 MED ORDER — SODIUM CHLORIDE 0.9 % IV SOLN
267.5000 mg | Freq: Once | INTRAVENOUS | Status: AC
  Administered 2019-07-23: 11:00:00 270 mg via INTRAVENOUS
  Filled 2019-07-23: qty 27

## 2019-07-23 MED ORDER — SODIUM CHLORIDE 0.9 % IV SOLN
Freq: Once | INTRAVENOUS | Status: AC
  Administered 2019-07-23: 09:00:00 via INTRAVENOUS
  Filled 2019-07-23: qty 250

## 2019-07-23 MED ORDER — SODIUM CHLORIDE 0.9% FLUSH
10.0000 mL | INTRAVENOUS | Status: DC | PRN
  Administered 2019-07-23: 12:00:00 10 mL
  Filled 2019-07-23: qty 10

## 2019-07-23 MED ORDER — SODIUM CHLORIDE 0.9 % IV SOLN
Freq: Once | INTRAVENOUS | Status: AC
  Administered 2019-07-23: 10:00:00 via INTRAVENOUS
  Filled 2019-07-23: qty 5

## 2019-07-23 MED ORDER — PALONOSETRON HCL INJECTION 0.25 MG/5ML
INTRAVENOUS | Status: AC
  Filled 2019-07-23: qty 5

## 2019-07-23 MED ORDER — SODIUM CHLORIDE 0.9 % IV SOLN
1000.0000 mg/m2 | Freq: Once | INTRAVENOUS | Status: AC
  Administered 2019-07-23: 11:00:00 1482 mg via INTRAVENOUS
  Filled 2019-07-23: qty 38.98

## 2019-07-23 MED ORDER — SODIUM CHLORIDE 0.9% FLUSH
10.0000 mL | INTRAVENOUS | Status: DC | PRN
  Administered 2019-07-23: 08:00:00 10 mL via INTRAVENOUS
  Filled 2019-07-23: qty 10

## 2019-07-23 NOTE — Progress Notes (Signed)
Nutrition Follow-up:  Patient with urothelial carcinoma of bladder.  Patient receiving carboplatin and gemcitabine.    Spoke with patient during infusion.  Reports that she continues to eat well with the help of megace.  Reports that she is eating well-balanced meals that her son and daughter in law prepare for her.  Enjoys ice cream for desserts.  Continues to tolerate meats, cheese, ice cream, eggs for good sources of protein.     Denies any nutrition impact symptoms.   Medications: reviewed  Labs: BUN 35, creatinine 1.22  Anthropometrics:   Weight 105 lb 6.4 oz today increased from 97 lb 1.6 oz on 9/15   NUTRITION DIAGNOSIS:  Inadequate oral intake improving    INTERVENTION:  Discussed importance of consuming well balanced diet for adequate nutrition and weight gain.  Continue appetite stimulant Patient has contact information    MONITORING, EVALUATION, GOAL:  Patient will consume adequate calories and protein to promote weight gain   NEXT VISIT: Tuesday, Oct 27 during infusion  Colleen Hughes B. Zenia Resides, [place], [place] Registered Dietitian [phone] (pager)

## 2019-07-23 NOTE — Patient Instructions (Signed)

## 2019-07-23 NOTE — Progress Notes (Signed)
Hematology and Oncology Follow Up Visit  Colleen Hughes [id number] 08/31/1936 83 y.o. 07/23/2019 8:31 AM Colleen Hughes, MDGriffin, [name], MD   Principle Diagnosis: 83 year old woman with bladder cancer diagnosed in 2019.  She presented with T4N1 urothelial carcinoma and developed a stage IV disease with a pelvic metastasis August 2020.  Prior Therapy:  Robotic assisted laparoscopic cystectomy with ileal conduit formation.  She is also bilateral lymphadenectomy completed on July 24, 2018.  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  Current therapy: Carboplatin and gemcitabine started on 06/11/2019.  She is status post 2 cycles of therapy and here for evaluation prior to cycle 3.  Interim History: [name] presents today for a follow-up visit.  Since her last visit, she reports no major changes in her health.  She continues to tolerate chemotherapy without any new complications.  She denies any nausea, vomiting or excessive fatigue.  She denies any worsening neuropathy or syncope.  Her ambulation is improving slowly currently using a walker without any falls or tripping.  She is eating better and has gained more weight since the last visit.  She is not taking any pain medication at this time.   She denied any alteration mental status, neuropathy, confusion or dizziness.  Denies any headaches or lethargy.  Denies any night sweats, weight loss or changes in appetite.  Denied orthopnea, dyspnea on exertion or chest discomfort.  Denies shortness of breath, difficulty breathing hemoptysis or cough.  Denies any abdominal distention, nausea, early satiety or dyspepsia.  Denies any hematuria, frequency, dysuria or nocturia.  Denies any skin irritation, dryness or rash.  Denies any ecchymosis or petechiae.  Denies any lymphadenopathy or clotting.  Denies any heat or cold intolerance.  Denies any anxiety or  depression.  Remaining review of system is negative. .         Medications: Reviewed without any changes. Current Outpatient Medications  Medication Sig Dispense Refill  . acetaminophen (TYLENOL) 500 MG tablet Take 500-1,000 mg by mouth every 6 (six) hours as needed for moderate pain or headache.     Marland Kitchen aspirin EC 81 MG tablet Take 81 mg by mouth daily.    . Calcium-Phosphorus-Vitamin D (CITRACAL +D3 PO) Take 2 tablets by mouth daily.    . Cholecalciferol (VITAMIN D3) 2000 units TABS Take 2,000 Units by mouth daily.    . megestrol (MEGACE) 40 MG/ML suspension TAKE 10 MLS (400 MG TOTAL) BY MOUTH 2 (TWO) TIMES DAILY. 240 mL 0  . Multiple Vitamin (MULTIVITAMIN WITH MINERALS) TABS tablet Take 1 tablet by mouth daily. [employer] Multivitamin    . prochlorperazine (COMPAZINE) 10 MG tablet Take 1 tablet (10 mg total) by mouth every 6 (six) hours as needed for nausea or vomiting. 30 tablet 0  . traMADol (ULTRAM) 50 MG tablet Take 1-2 tablets (50-100 mg total) by mouth every 6 (six) hours as needed for moderate pain or severe pain. Post-operatively 20 tablet 0  . triamterene-hydrochlorothiazide (MAXZIDE-25) 37.5-25 MG tablet Take 1 tablet by mouth daily.     No current facility-administered medications for this visit.    Facility-Administered Medications Ordered in Other Visits  Medication Dose Route Frequency Provider Last Rate Last Dose  . sodium chloride flush (NS) 0.9 % injection 10 mL  10 mL Intravenous PRN Wyatt Portela, MD   10 mL at 07/23/19 0820     Allergies:  Allergies  Allergen Reactions  .  Codeine Nausea Only    Past Medical History, Surgical history, Social history, and Family History unchanged on review.   Physical Exam:   Blood pressure 140/79, pulse 97, temperature 98.7 F (37.1 C), temperature source Oral, resp. rate 18, height 5\' 6"  (1.676 m), weight 105 lb 6.4 oz (47.8 kg), SpO2 100 %.    ECOG: 1     General appearance: Alert, awake without any  distress. Head: Atraumatic without abnormalities Oropharynx: Without any thrush or ulcers. Eyes: No scleral icterus. Lymph nodes: No lymphadenopathy noted in the cervical, supraclavicular, or axillary nodes Heart:regular rate and rhythm, without any murmurs or gallops.   Lung: Clear to auscultation without any rhonchi, wheezes or dullness to percussion. Abdomin: Soft, nontender without any shifting dullness or ascites. Musculoskeletal: No clubbing or cyanosis. Neurological: No motor or sensory deficits. Skin: No rashes or lesions. Psychiatric: Mood and affect appeared normal.        Lab Results: Lab Results  Component Value Date   WBC 12.2 (H) 07/01/2019   HGB 8.4 (L) 07/01/2019   HCT 26.9 (L) 07/01/2019   MCV 89.4 07/01/2019   PLT 625 (H) 07/01/2019     Chemistry      Component Value Date/Time   NA 141 07/01/2019 0856   K 4.9 07/01/2019 0856   CL 112 (H) 07/01/2019 0856   CO2 21 (L) 07/01/2019 0856   BUN 29 (H) 07/01/2019 0856   CREATININE 1.03 (H) 07/01/2019 0856      Component Value Date/Time   CALCIUM 8.7 (L) 07/01/2019 0856   ALKPHOS 69 07/01/2019 0856   AST 18 07/01/2019 0856   ALT 15 07/01/2019 0856   BILITOT <0.2 (L) 07/01/2019 0856          Impression and Plan:  83 year old woman with:  1.  Bladder cancer diagnosed in 2019 with localized disease and subsequently developed stage IV with pelvic involvement.    He completed 2 cycles of chemotherapy without any major complications.  Risks and benefits of continuing this therapy for total 6 cycles was discussed.  Potential complications including myelosuppression, worsening fatigue, neutropenia as well as recurrent infections.  The plan is to repeat imaging studies after cycle 3 of therapy.  She is agreeable with this plan.   2.  Renal insufficiency: Her creatinine clearance remains relatively stable without any further adjustment of her platinum therapy.  3.  Anemia: Related to malignancy as well as  chemotherapy.  Her hemoglobin is relatively stable at this time and does not require any transfusion.  4.  Anorexia: Continues to improve and has gained more weight since the last visit.  5.  IV access: Port-A-Cath continues to be in use without any issues.  6.  Antiemetics: Compazine is available to her without any recent nausea or vomiting.  7.  Follow-up: She will return in 3 weeks after CT scan and evaluation.  25  minutes was spent with the patient face-to-face today.  More than 50% of time was spent on updating her disease status, treatment options and addressing complications related therapy.  This was discussed with the patient face-to-face and with her son via phone.    Zola Button, MD 10/7/20208:31 AM

## 2019-07-23 NOTE — Patient Instructions (Signed)
[facility] Discharge Instructions for Patients Receiving Chemotherapy  Today you received the following chemotherapy agents Gemzar; carboplatin  To help prevent nausea and vomiting after your treatment, we encourage you to take your nausea medication as directed. If you develop nausea and vomiting that is not controlled by your nausea medication, call the clinic.   BELOW ARE SYMPTOMS THAT SHOULD BE REPORTED IMMEDIATELY:  *FEVER GREATER THAN 100.5 F  *CHILLS WITH OR WITHOUT FEVER  NAUSEA AND VOMITING THAT IS NOT CONTROLLED WITH YOUR NAUSEA MEDICATION  *UNUSUAL SHORTNESS OF BREATH  *UNUSUAL BRUISING OR BLEEDING  TENDERNESS IN MOUTH AND THROAT WITH OR WITHOUT PRESENCE OF ULCERS  *URINARY PROBLEMS  *BOWEL PROBLEMS  UNUSUAL RASH Items with * indicate a potential emergency and should be followed up as soon as possible.  Feel free to call the clinic should you have any questions or concerns. The clinic phone number is [phone].  Please show the [place] at check-in to the Emergency Department and triage nurse.  Gemcitabine injection What is this medicine? GEMCITABINE (jem SYE ta been) is a chemotherapy drug. This medicine is used to treat many types of cancer like breast cancer, lung cancer, pancreatic cancer, and ovarian cancer. This medicine may be used for other purposes; ask your health care provider or pharmacist if you have questions. COMMON BRAND NAME(S): Gemzar, Infugem What should I tell my health care provider before I take this medicine? They need to know if you have any of these conditions:  blood disorders  infection  kidney disease  liver disease  lung or breathing disease, like asthma  recent or ongoing radiation therapy  an unusual or allergic reaction to gemcitabine, other chemotherapy, other medicines, foods, dyes, or preservatives  pregnant or trying to get pregnant  breast-feeding How should I use this medicine? This  drug is given as an infusion into a vein. It is administered in a hospital or clinic by a specially trained health care professional. Talk to your pediatrician regarding the use of this medicine in children. Special care may be needed. Overdosage: If you think you have taken too much of this medicine contact a poison control center or emergency room at once. NOTE: This medicine is only for you. Do not share this medicine with others. What if I miss a dose? It is important not to miss your dose. Call your doctor or health care professional if you are unable to keep an appointment. What may interact with this medicine?  medicines to increase blood counts like filgrastim, pegfilgrastim, sargramostim  some other chemotherapy drugs like cisplatin  vaccines Talk to your doctor or health care professional before taking any of these medicines:  acetaminophen  aspirin  ibuprofen  ketoprofen  naproxen This list may not describe all possible interactions. Give your health care provider a list of all the medicines, herbs, non-prescription drugs, or dietary supplements you use. Also tell them if you smoke, drink alcohol, or use illegal drugs. Some items may interact with your medicine. What should I watch for while using this medicine? Visit your doctor for checks on your progress. This drug may make you feel generally unwell. This is not uncommon, as chemotherapy can affect healthy cells as well as cancer cells. Report any side effects. Continue your course of treatment even though you feel ill unless your doctor tells you to stop. In some cases, you may be given additional medicines to help with side effects. Follow all directions for their use. Call your  doctor or health care professional for advice if you get a fever, chills or sore throat, or other symptoms of a cold or flu. Do not treat yourself. This drug decreases your body's ability to fight infections. Try to avoid being around people who  are sick. This medicine may increase your risk to bruise or bleed. Call your doctor or health care professional if you notice any unusual bleeding. Be careful brushing and flossing your teeth or using a toothpick because you may get an infection or bleed more easily. If you have any dental work done, tell your dentist you are receiving this medicine. Avoid taking products that contain aspirin, acetaminophen, ibuprofen, naproxen, or ketoprofen unless instructed by your doctor. These medicines may hide a fever. Do not become pregnant while taking this medicine or for 6 months after stopping it. Women should inform their doctor if they wish to become pregnant or think they might be pregnant. Men should not father a child while taking this medicine and for 3 months after stopping it. There is a potential for serious side effects to an unborn child. Talk to your health care professional or pharmacist for more information. Do not breast-feed an infant while taking this medicine or for at least 1 week after stopping it. Men should inform their doctors if they wish to father a child. This medicine may lower sperm counts. Talk with your doctor or health care professional if you are concerned about your fertility. What side effects may I notice from receiving this medicine? Side effects that you should report to your doctor or health care professional as soon as possible:  allergic reactions like skin rash, itching or hives, swelling of the face, lips, or tongue  breathing problems  pain, redness, or irritation at site where injected  signs and symptoms of a dangerous change in heartbeat or heart rhythm like chest pain; dizziness; fast or irregular heartbeat; palpitations; feeling faint or lightheaded, falls; breathing problems  signs of decreased platelets or bleeding - bruising, pinpoint red spots on the skin, black, tarry stools, blood in the urine  signs of decreased red blood cells - unusually weak or  tired, feeling faint or lightheaded, falls  signs of infection - fever or chills, cough, sore throat, pain or difficulty passing urine  signs and symptoms of kidney injury like trouble passing urine or change in the amount of urine  signs and symptoms of liver injury like dark yellow or brown urine; general ill feeling or flu-like symptoms; light-colored stools; loss of appetite; nausea; right upper belly pain; unusually weak or tired; yellowing of the eyes or skin  swelling of ankles, feet, hands Side effects that usually do not require medical attention (report to your doctor or health care professional if they continue or are bothersome):  constipation  diarrhea  hair loss  loss of appetite  nausea  rash  vomiting This list may not describe all possible side effects. Call your doctor for medical advice about side effects. You may report side effects to FDA at 1-800-FDA-1088. Where should I keep my medicine? This drug is given in a hospital or clinic and will not be stored at home. NOTE: This sheet is a summary. It may not cover all possible information. If you have questions about this medicine, talk to your doctor, pharmacist, or health care provider.  2020 Elsevier/Gold Standard (2017-12-26 18:06:11)   Carboplatin injection What is this medicine? CARBOPLATIN (KAR boe pla tin) is a chemotherapy drug. It targets fast dividing cells,  like cancer cells, and causes these cells to die. This medicine is used to treat ovarian cancer and many other cancers. This medicine may be used for other purposes; ask your health care provider or pharmacist if you have questions. COMMON BRAND NAME(S): Paraplatin What should I tell my health care provider before I take this medicine? They need to know if you have any of these conditions:  blood disorders  hearing problems  kidney disease  recent or ongoing radiation therapy  an unusual or allergic reaction to carboplatin, cisplatin,  other chemotherapy, other medicines, foods, dyes, or preservatives  pregnant or trying to get pregnant  breast-feeding How should I use this medicine? This drug is usually given as an infusion into a vein. It is administered in a hospital or clinic by a specially trained health care professional. Talk to your pediatrician regarding the use of this medicine in children. Special care may be needed. Overdosage: If you think you have taken too much of this medicine contact a poison control center or emergency room at once. NOTE: This medicine is only for you. Do not share this medicine with others. What if I miss a dose? It is important not to miss a dose. Call your doctor or health care professional if you are unable to keep an appointment. What may interact with this medicine?  medicines for seizures  medicines to increase blood counts like filgrastim, pegfilgrastim, sargramostim  some antibiotics like amikacin, gentamicin, neomycin, streptomycin, tobramycin  vaccines Talk to your doctor or health care professional before taking any of these medicines:  acetaminophen  aspirin  ibuprofen  ketoprofen  naproxen This list may not describe all possible interactions. Give your health care provider a list of all the medicines, herbs, non-prescription drugs, or dietary supplements you use. Also tell them if you smoke, drink alcohol, or use illegal drugs. Some items may interact with your medicine. What should I watch for while using this medicine? Your condition will be monitored carefully while you are receiving this medicine. You will need important blood work done while you are taking this medicine. This drug may make you feel generally unwell. This is not uncommon, as chemotherapy can affect healthy cells as well as cancer cells. Report any side effects. Continue your course of treatment even though you feel ill unless your doctor tells you to stop. In some cases, you may be given  additional medicines to help with side effects. Follow all directions for their use. Call your doctor or health care professional for advice if you get a fever, chills or sore throat, or other symptoms of a cold or flu. Do not treat yourself. This drug decreases your body's ability to fight infections. Try to avoid being around people who are sick. This medicine may increase your risk to bruise or bleed. Call your doctor or health care professional if you notice any unusual bleeding. Be careful brushing and flossing your teeth or using a toothpick because you may get an infection or bleed more easily. If you have any dental work done, tell your dentist you are receiving this medicine. Avoid taking products that contain aspirin, acetaminophen, ibuprofen, naproxen, or ketoprofen unless instructed by your doctor. These medicines may hide a fever. Do not become pregnant while taking this medicine. Women should inform their doctor if they wish to become pregnant or think they might be pregnant. There is a potential for serious side effects to an unborn child. Talk to your health care professional or pharmacist for more  information. Do not breast-feed an infant while taking this medicine. What side effects may I notice from receiving this medicine? Side effects that you should report to your doctor or health care professional as soon as possible:  allergic reactions like skin rash, itching or hives, swelling of the face, lips, or tongue  signs of infection - fever or chills, cough, sore throat, pain or difficulty passing urine  signs of decreased platelets or bleeding - bruising, pinpoint red spots on the skin, black, tarry stools, nosebleeds  signs of decreased red blood cells - unusually weak or tired, fainting spells, lightheadedness  breathing problems  changes in hearing  changes in vision  chest pain  high blood pressure  low blood counts - This drug may decrease the number of white blood  cells, red blood cells and platelets. You may be at increased risk for infections and bleeding.  nausea and vomiting  pain, swelling, redness or irritation at the injection site  pain, tingling, numbness in the hands or feet  problems with balance, talking, walking  trouble passing urine or change in the amount of urine Side effects that usually do not require medical attention (report to your doctor or health care professional if they continue or are bothersome):  hair loss  loss of appetite  metallic taste in the mouth or changes in taste This list may not describe all possible side effects. Call your doctor for medical advice about side effects. You may report side effects to FDA at 1-800-FDA-1088. Where should I keep my medicine? This drug is given in a hospital or clinic and will not be stored at home. NOTE: This sheet is a summary. It may not cover all possible information. If you have questions about this medicine, talk to your doctor, pharmacist, or health care provider.  2020 Elsevier/Gold Standard (2008-01-07 14:38:05)

## 2019-07-26 ENCOUNTER — Other Ambulatory Visit: Payer: Self-pay | Admitting: Oncology

## 2019-07-29 ENCOUNTER — Telehealth: Payer: Self-pay | Admitting: Oncology

## 2019-07-29 NOTE — Telephone Encounter (Signed)
Called and left msg. Mailed printout  °

## 2019-08-07 ENCOUNTER — Other Ambulatory Visit: Payer: Self-pay | Admitting: Oncology

## 2019-08-12 ENCOUNTER — Inpatient Hospital Stay: Payer: Medicare Other

## 2019-08-12 ENCOUNTER — Inpatient Hospital Stay (HOSPITAL_BASED_OUTPATIENT_CLINIC_OR_DEPARTMENT_OTHER): Payer: Medicare Other | Admitting: Oncology

## 2019-08-12 ENCOUNTER — Other Ambulatory Visit: Payer: Self-pay

## 2019-08-12 ENCOUNTER — Telehealth: Payer: Self-pay | Admitting: Oncology

## 2019-08-12 VITALS — BP 133/76 | HR 90 | Temp 98.2°F | Resp 18 | Ht 66.0 in | Wt 111.5 lb

## 2019-08-12 DIAGNOSIS — C679 Malignant neoplasm of bladder, unspecified: Secondary | ICD-10-CM

## 2019-08-12 DIAGNOSIS — Z95828 Presence of other vascular implants and grafts: Secondary | ICD-10-CM

## 2019-08-12 DIAGNOSIS — C678 Malignant neoplasm of overlapping sites of bladder: Secondary | ICD-10-CM

## 2019-08-12 DIAGNOSIS — Z5111 Encounter for antineoplastic chemotherapy: Secondary | ICD-10-CM | POA: Diagnosis not present

## 2019-08-12 LAB — CMP (CANCER CENTER ONLY)
ALT: 10 U/L (ref 0–44)
AST: 14 U/L — ABNORMAL LOW (ref 15–41)
Albumin: 3.5 g/dL (ref 3.5–5.0)
Alkaline Phosphatase: 61 U/L (ref 38–126)
Anion gap: 8 (ref 5–15)
BUN: 32 mg/dL — ABNORMAL HIGH (ref 8–23)
CO2: 19 mmol/L — ABNORMAL LOW (ref 22–32)
Calcium: 8.6 mg/dL — ABNORMAL LOW (ref 8.9–10.3)
Chloride: 114 mmol/L — ABNORMAL HIGH (ref 98–111)
Creatinine: 1.32 mg/dL — ABNORMAL HIGH (ref 0.44–1.00)
GFR, Est AFR Am: 44 mL/min — ABNORMAL LOW (ref >60)
GFR, Estimated: 38 mL/min — ABNORMAL LOW (ref >60)
Glucose, Bld: 70 mg/dL (ref 70–99)
Potassium: 4.3 mmol/L (ref 3.5–5.1)
Sodium: 141 mmol/L (ref 135–145)
Total Bilirubin: 0.2 mg/dL — ABNORMAL LOW (ref 0.3–1.2)
Total Protein: 6.8 g/dL (ref 6.5–8.1)

## 2019-08-12 LAB — CBC WITH DIFFERENTIAL (CANCER CENTER ONLY)
Abs Immature Granulocytes: 0.27 10*3/uL — ABNORMAL HIGH (ref 0.00–0.07)
Basophils Absolute: 0 10*3/uL (ref 0.0–0.1)
Basophils Relative: 0 %
Eosinophils Absolute: 0 10*3/uL (ref 0.0–0.5)
Eosinophils Relative: 0 %
HCT: 29.6 % — ABNORMAL LOW (ref 36.0–46.0)
Hemoglobin: 9.2 g/dL — ABNORMAL LOW (ref 12.0–15.0)
Immature Granulocytes: 5 %
Lymphocytes Relative: 50 %
Lymphs Abs: 2.7 10*3/uL (ref 0.7–4.0)
MCH: 31.5 pg (ref 26.0–34.0)
MCHC: 31.1 g/dL (ref 30.0–36.0)
MCV: 101.4 fL — ABNORMAL HIGH (ref 80.0–100.0)
Monocytes Absolute: 1 10*3/uL (ref 0.1–1.0)
Monocytes Relative: 19 %
Neutro Abs: 1.4 10*3/uL — ABNORMAL LOW (ref 1.7–7.7)
Neutrophils Relative %: 26 %
Platelet Count: 237 10*3/uL (ref 150–400)
RBC: 2.92 MIL/uL — ABNORMAL LOW (ref 3.87–5.11)
RDW: 23 % — ABNORMAL HIGH (ref 11.5–15.5)
WBC Count: 5.3 10*3/uL (ref 4.0–10.5)
nRBC: 0.4 % — ABNORMAL HIGH (ref 0.0–0.2)

## 2019-08-12 MED ORDER — SODIUM CHLORIDE 0.9 % IV SOLN
Freq: Once | INTRAVENOUS | Status: AC
  Administered 2019-08-12: 11:00:00 via INTRAVENOUS
  Filled 2019-08-12: qty 5

## 2019-08-12 MED ORDER — HEPARIN SOD (PORK) LOCK FLUSH 100 UNIT/ML IV SOLN
500.0000 [IU] | Freq: Once | INTRAVENOUS | Status: AC | PRN
  Administered 2019-08-12: 500 [IU]
  Filled 2019-08-12: qty 5

## 2019-08-12 MED ORDER — PALONOSETRON HCL INJECTION 0.25 MG/5ML
INTRAVENOUS | Status: AC
  Filled 2019-08-12: qty 5

## 2019-08-12 MED ORDER — SODIUM CHLORIDE 0.9 % IV SOLN
Freq: Once | INTRAVENOUS | Status: AC
  Administered 2019-08-12: 11:00:00 via INTRAVENOUS
  Filled 2019-08-12: qty 250

## 2019-08-12 MED ORDER — SODIUM CHLORIDE 0.9% FLUSH
10.0000 mL | INTRAVENOUS | Status: DC | PRN
  Administered 2019-08-12: 10:00:00 10 mL via INTRAVENOUS
  Filled 2019-08-12: qty 10

## 2019-08-12 MED ORDER — SODIUM CHLORIDE 0.9 % IV SOLN
1000.0000 mg/m2 | Freq: Once | INTRAVENOUS | Status: AC
  Administered 2019-08-12: 1482 mg via INTRAVENOUS
  Filled 2019-08-12: qty 38.98

## 2019-08-12 MED ORDER — SODIUM CHLORIDE 0.9% FLUSH
10.0000 mL | INTRAVENOUS | Status: DC | PRN
  Administered 2019-08-12: 10 mL
  Filled 2019-08-12: qty 10

## 2019-08-12 MED ORDER — PALONOSETRON HCL INJECTION 0.25 MG/5ML
0.2500 mg | Freq: Once | INTRAVENOUS | Status: AC
  Administered 2019-08-12: 0.25 mg via INTRAVENOUS

## 2019-08-12 MED ORDER — SODIUM CHLORIDE 0.9 % IV SOLN
256.5000 mg | Freq: Once | INTRAVENOUS | Status: AC
  Administered 2019-08-12: 260 mg via INTRAVENOUS
  Filled 2019-08-12: qty 26

## 2019-08-12 NOTE — Telephone Encounter (Signed)
Scheduled appt per 10/27 los.

## 2019-08-12 NOTE — Progress Notes (Signed)
Hematology and Oncology Follow Up Visit  Colleen Hughes [id number] Mar 24, 1936 83 y.o. 08/12/2019 9:59 AM [name], MDGriffin, [name], MD   Principle Diagnosis: 83 year old woman with stage IV bladder cancer documented in August 2020 with pelvic metastasis.  He initially presented withT4N1 urothelial carcinoma in 2019.  Prior Therapy:  Robotic assisted laparoscopic cystectomy with ileal conduit formation.  She is also bilateral lymphadenectomy completed on July 24, 2018.  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  Current therapy: Carboplatin and gemcitabine started on 06/11/2019.  She is status post 3 cycles of therapy and here for evaluation prior to cycle 4 of.  Interim History: Ms. Kennington returns today for a repeat evaluation.  Since her last visit, she continues to tolerate chemotherapy without any major complications.  She denies any nausea, vomiting or worsening neuropathy.  She denies recent hospitalization or illnesses.  He is eating better and continues to gain weight at this time.  Her mobility is also improving with improved strength and mobility.  She does require a wheelchair for her visits but at home she is walking independently.  Patient denied headaches, blurry vision, syncope or seizures.  Denies any fevers, chills or sweats.  Denied chest pain, palpitation, orthopnea or leg edema.  Denied cough, wheezing or hemoptysis.  Denied nausea, vomiting or abdominal pain.  Denies any constipation or diarrhea.  Denies any frequency urgency or hesitancy.  Denies any arthralgias or myalgias.  Denies any skin rashes or lesions.  Denies any bleeding or clotting tendency.  Denies any easy bruising.  Denies any hair or nail changes.  Denies any anxiety or depression.  Remaining review of system is negative.   .         Medications: Unchanged on review. Current Outpatient  Medications  Medication Sig Dispense Refill  . acetaminophen (TYLENOL) 500 MG tablet Take 500-1,000 mg by mouth every 6 (six) hours as needed for moderate pain or headache.     Marland Kitchen aspirin EC 81 MG tablet Take 81 mg by mouth daily.    . Calcium-Phosphorus-Vitamin D (CITRACAL +D3 PO) Take 2 tablets by mouth daily.    . Cholecalciferol (VITAMIN D3) 2000 units TABS Take 2,000 Units by mouth daily.    . megestrol (MEGACE) 40 MG/ML suspension TAKE 10 MLS (400 MG TOTAL) BY MOUTH 2 (TWO) TIMES DAILY. 240 mL 0  . Multiple Vitamin (MULTIVITAMIN WITH MINERALS) TABS tablet Take 1 tablet by mouth daily. [employer] Multivitamin    . prochlorperazine (COMPAZINE) 10 MG tablet Take 1 tablet (10 mg total) by mouth every 6 (six) hours as needed for nausea or vomiting. 30 tablet 0  . traMADol (ULTRAM) 50 MG tablet Take 1-2 tablets (50-100 mg total) by mouth every 6 (six) hours as needed for moderate pain or severe pain. Post-operatively 20 tablet 0  . triamterene-hydrochlorothiazide (MAXZIDE-25) 37.5-25 MG tablet Take 1 tablet by mouth daily.     No current facility-administered medications for this visit.    Facility-Administered Medications Ordered in Other Visits  Medication Dose Route Frequency Provider Last Rate Last Dose  . sodium chloride flush (NS) 0.9 % injection 10 mL  10 mL Intravenous PRN Wyatt Portela, MD   10 mL at 08/12/19 [id number]     Allergies:  Allergies  Allergen Reactions  . Codeine Nausea Only    Past Medical History, Surgical history, Social history, and Family History updated without any changes.  Physical Exam:  Blood pressure 133/76, pulse 90, temperature 98.2 F (36.8 C), temperature source Oral, resp. rate 18, height 5\' 6"  (1.676 m), weight 111 lb 8 oz (50.6 kg), SpO2 100 %.      ECOG: 1   General appearance: Comfortable appearing without any discomfort Head: Normocephalic without any trauma Oropharynx: Mucous membranes are moist and pink without any thrush or  ulcers. Eyes: Pupils are equal and round reactive to light. Lymph nodes: No cervical, supraclavicular, inguinal or axillary lymphadenopathy.   Heart:regular rate and rhythm.  S1 and S2 without leg edema. Lung: Clear without any rhonchi or wheezes.  No dullness to percussion. Abdomin: Soft, nontender, nondistended with good bowel sounds.  No hepatosplenomegaly. Musculoskeletal: No joint deformity or effusion.  Full range of motion noted. Neurological: No deficits noted on motor, sensory and deep tendon reflex exam. Skin: No petechial rash or dryness.  Appeared moist.          Lab Results: Lab Results  Component Value Date   WBC 9.7 07/23/2019   HGB 8.9 (L) 07/23/2019   HCT 29.6 (L) 07/23/2019   MCV 98.3 07/23/2019   PLT 292 07/23/2019     Chemistry      Component Value Date/Time   NA 141 07/23/2019 0830   K 4.4 07/23/2019 0830   CL 113 (H) 07/23/2019 0830   CO2 18 (L) 07/23/2019 0830   BUN 35 (H) 07/23/2019 0830   CREATININE 1.22 (H) 07/23/2019 0830      Component Value Date/Time   CALCIUM 8.5 (L) 07/23/2019 0830   ALKPHOS 60 07/23/2019 0830   AST 13 (L) 07/23/2019 0830   ALT 6 07/23/2019 0830   BILITOT 0.2 (L) 07/23/2019 0830          Impression and Plan:  83 year old woman with:  1.  Stage IV high-grade urothelial carcinoma of the bladder diagnosed in 2020 with pelvic involvement.    She continues to tolerate chemotherapy without any major complaints at this time.  Risks and benefits of proceeding with cycle 4 was discussed.  At this time, he is experiencing excellent clinical benefit and I recommended proceeding without any dose reduction or delay.  CT scan is scheduled in the near future for staging purposes will hopefully confirm a reasonable response at this time.  Plan is to complete 6 cycles of therapy tentatively.   2.  Renal insufficiency: Creatinine clearance slightly declined will continue to monitor on platinum based therapy.  3.  Anemia: Her  hemoglobin continues to improve without any need for transfusion.  Her anemia is related to malignancy and chemotherapy.  4.  Anorexia: Resolved at this time she continues to gain weight.  5.  IV access: Port-A-Cath remains in place and continues to be in use.  6.  Antiemetics: No issues reported with nausea vomiting at this time.  Antiemetics are available to her.  7.  Follow-up: In 3 weeks for evaluation of for the next cycle of therapy.  25  minutes was spent with the patient face-to-face today.  More than 50% of time was dedicated to reviewing laboratory data, reviewing her disease status, discussing treatment options and future plan of care with her personally and her son via phone.    Zola Button, MD 10/27/20209:59 AM

## 2019-08-12 NOTE — Progress Notes (Signed)
Per Dr. Alen Blew, okay to treat with [place] 1.4.

## 2019-08-12 NOTE — Progress Notes (Signed)
Nutrition Follow-up:  Patient with urothelial carcinoma of bladder.  Patient receiving chemotherapy.    Met with patient during infusion.  Patient reports good appetite continues.  She reports that son and daughter-in-law are cooking for her.  She is taking some steps with walker at home.  Talked to RD about her 3 dogs.    Denies any nutrition impact symptoms   Medications: megace  Labs: reviewed  Anthropometrics:   Weight increased to 111 lb 8 oz today from 105 lb 6.4 oz.  97 lb on 9/15   NUTRITION DIAGNOSIS: Inadequate oral intake improved   INTERVENTION:  Patient continues to eat well and gain weight.   Encouraged her to continue eating well-balanced meals to continue weight gain and improve strength and nutrition.   Patient has contact information and will reach out to RD if appetite changes.      MONITORING, EVALUATION, GOAL: Patient is consuming adequate calories and protein to promote weight gain   NEXT VISIT: no follow-up, patient to call RD if needed  Kenlyn Lose B. Zenia Resides, [place], [place] Registered Dietitian [phone] (pager)

## 2019-08-12 NOTE — Patient Instructions (Signed)
[facility] Discharge Instructions for Patients Receiving Chemotherapy  Today you received the following chemotherapy agents: Gemzar/Carboplatin.  To help prevent nausea and vomiting after your treatment, we encourage you to take your nausea medication as directed.   If you develop nausea and vomiting that is not controlled by your nausea medication, call the clinic.   BELOW ARE SYMPTOMS THAT SHOULD BE REPORTED IMMEDIATELY:  *FEVER GREATER THAN 100.5 F  *CHILLS WITH OR WITHOUT FEVER  NAUSEA AND VOMITING THAT IS NOT CONTROLLED WITH YOUR NAUSEA MEDICATION  *UNUSUAL SHORTNESS OF BREATH  *UNUSUAL BRUISING OR BLEEDING  TENDERNESS IN MOUTH AND THROAT WITH OR WITHOUT PRESENCE OF ULCERS  *URINARY PROBLEMS  *BOWEL PROBLEMS  UNUSUAL RASH Items with * indicate a potential emergency and should be followed up as soon as possible.  Feel free to call the clinic should you have any questions or concerns. The clinic phone number is [phone].  Please show the CHEMO ALERT CARD at check-in to the Emergency Department and triage nurse.   

## 2019-08-15 ENCOUNTER — Other Ambulatory Visit: Payer: Self-pay

## 2019-08-15 ENCOUNTER — Other Ambulatory Visit: Payer: Medicare Other

## 2019-08-15 ENCOUNTER — Ambulatory Visit (HOSPITAL_COMMUNITY)
Admission: RE | Admit: 2019-08-15 | Discharge: 2019-08-15 | Disposition: A | Discharge status: Home / Self Care | Payer: Medicare Other | Source: Ambulatory Visit | Attending: Oncology | Admitting: Oncology

## 2019-08-15 DIAGNOSIS — C9000 Multiple myeloma not having achieved remission: Secondary | ICD-10-CM | POA: Insufficient documentation

## 2019-08-15 DIAGNOSIS — C679 Malignant neoplasm of bladder, unspecified: Secondary | ICD-10-CM | POA: Diagnosis not present

## 2019-08-15 MED ORDER — SODIUM CHLORIDE 0.9 % IV SOLN
INTRAVENOUS | Status: AC
  Filled 2019-08-15: qty 250

## 2019-08-15 MED ORDER — SODIUM CHLORIDE (PF) 0.9 % IJ SOLN
INTRAMUSCULAR | Status: AC
  Filled 2019-08-15: qty 50

## 2019-08-18 ENCOUNTER — Telehealth: Payer: Self-pay

## 2019-08-18 ENCOUNTER — Telehealth: Payer: Self-pay | Admitting: Oncology

## 2019-08-18 NOTE — Telephone Encounter (Signed)
Called patient and let her know that we are not able to text images of scans. Informed patient that we can request a disc of images from Radiology. Patient stated she would like a copy. Spoke to [place] in Radiology and made the request. Tedra Coupe will call Dr. Hazeline Junker desk nurse when the disc is ready for pick up. Patient verbalized understanding.

## 2019-08-18 NOTE — Telephone Encounter (Signed)
Received call from patient son, Marvis Repress. Per [name] they just spoke with Dr. Alen Blew re patients ct scan. Patient son request Dr. Alen Blew text image of ct to patients cell phone if possible. Cell number [phone], Message forwarded to [facility] nurse.

## 2019-08-21 ENCOUNTER — Other Ambulatory Visit: Payer: Self-pay | Admitting: Oncology

## 2019-08-21 ENCOUNTER — Inpatient Hospital Stay: Payer: Medicare Other | Admitting: Oncology

## 2019-09-03 ENCOUNTER — Inpatient Hospital Stay: Payer: Medicare Other

## 2019-09-03 ENCOUNTER — Other Ambulatory Visit: Payer: Self-pay

## 2019-09-03 ENCOUNTER — Inpatient Hospital Stay: Payer: Medicare Other | Attending: Oncology | Admitting: Oncology

## 2019-09-03 VITALS — BP 140/78 | HR 87 | Temp 99.2°F | Resp 18 | Ht 66.0 in | Wt 117.8 lb

## 2019-09-03 DIAGNOSIS — C775 Secondary and unspecified malignant neoplasm of intrapelvic lymph nodes: Secondary | ICD-10-CM | POA: Insufficient documentation

## 2019-09-03 DIAGNOSIS — Z5111 Encounter for antineoplastic chemotherapy: Secondary | ICD-10-CM | POA: Insufficient documentation

## 2019-09-03 DIAGNOSIS — C678 Malignant neoplasm of overlapping sites of bladder: Secondary | ICD-10-CM

## 2019-09-03 DIAGNOSIS — C679 Malignant neoplasm of bladder, unspecified: Secondary | ICD-10-CM | POA: Insufficient documentation

## 2019-09-03 DIAGNOSIS — Z95828 Presence of other vascular implants and grafts: Secondary | ICD-10-CM

## 2019-09-03 LAB — CMP (CANCER CENTER ONLY)
ALT: 11 U/L (ref 0–44)
AST: 15 U/L (ref 15–41)
Albumin: 3.8 g/dL (ref 3.5–5.0)
Alkaline Phosphatase: 56 U/L (ref 38–126)
Anion gap: 10 (ref 5–15)
BUN: 30 mg/dL — ABNORMAL HIGH (ref 8–23)
CO2: 19 mmol/L — ABNORMAL LOW (ref 22–32)
Calcium: 8.9 mg/dL (ref 8.9–10.3)
Chloride: 112 mmol/L — ABNORMAL HIGH (ref 98–111)
Creatinine: 1.42 mg/dL — ABNORMAL HIGH (ref 0.44–1.00)
GFR, Est AFR Am: 40 mL/min — ABNORMAL LOW (ref >60)
GFR, Estimated: 35 mL/min — ABNORMAL LOW (ref >60)
Glucose, Bld: 85 mg/dL (ref 70–99)
Potassium: 4.8 mmol/L (ref 3.5–5.1)
Sodium: 141 mmol/L (ref 135–145)
Total Bilirubin: 0.2 mg/dL — ABNORMAL LOW (ref 0.3–1.2)
Total Protein: 6.9 g/dL (ref 6.5–8.1)

## 2019-09-03 LAB — CBC WITH DIFFERENTIAL (CANCER CENTER ONLY)
Abs Immature Granulocytes: 0.05 10*3/uL (ref 0.00–0.07)
Basophils Absolute: 0 10*3/uL (ref 0.0–0.1)
Basophils Relative: 0 %
Eosinophils Absolute: 0 10*3/uL (ref 0.0–0.5)
Eosinophils Relative: 0 %
HCT: 29 % — ABNORMAL LOW (ref 36.0–46.0)
Hemoglobin: 9 g/dL — ABNORMAL LOW (ref 12.0–15.0)
Immature Granulocytes: 1 %
Lymphocytes Relative: 52 %
Lymphs Abs: 2.5 10*3/uL (ref 0.7–4.0)
MCH: 32.6 pg (ref 26.0–34.0)
MCHC: 31 g/dL (ref 30.0–36.0)
MCV: 105.1 fL — ABNORMAL HIGH (ref 80.0–100.0)
Monocytes Absolute: 0.7 10*3/uL (ref 0.1–1.0)
Monocytes Relative: 14 %
Neutro Abs: 1.6 10*3/uL — ABNORMAL LOW (ref 1.7–7.7)
Neutrophils Relative %: 33 %
Platelet Count: 207 10*3/uL (ref 150–400)
RBC: 2.76 MIL/uL — ABNORMAL LOW (ref 3.87–5.11)
RDW: 20 % — ABNORMAL HIGH (ref 11.5–15.5)
WBC Count: 4.8 10*3/uL (ref 4.0–10.5)
nRBC: 0 % (ref 0.0–0.2)

## 2019-09-03 MED ORDER — SODIUM CHLORIDE 0.9 % IV SOLN
1000.0000 mg/m2 | Freq: Once | INTRAVENOUS | Status: AC
  Administered 2019-09-03: 1482 mg via INTRAVENOUS
  Filled 2019-09-03: qty 38.98

## 2019-09-03 MED ORDER — HEPARIN SOD (PORK) LOCK FLUSH 100 UNIT/ML IV SOLN
500.0000 [IU] | Freq: Once | INTRAVENOUS | Status: AC | PRN
  Administered 2019-09-03: 500 [IU]
  Filled 2019-09-03: qty 5

## 2019-09-03 MED ORDER — SODIUM CHLORIDE 0.9 % IV SOLN
260.0000 mg | Freq: Once | INTRAVENOUS | Status: AC
  Administered 2019-09-03: 260 mg via INTRAVENOUS
  Filled 2019-09-03: qty 26

## 2019-09-03 MED ORDER — PALONOSETRON HCL INJECTION 0.25 MG/5ML
INTRAVENOUS | Status: AC
  Filled 2019-09-03: qty 5

## 2019-09-03 MED ORDER — PALONOSETRON HCL INJECTION 0.25 MG/5ML
0.2500 mg | Freq: Once | INTRAVENOUS | Status: AC
  Administered 2019-09-03: 0.25 mg via INTRAVENOUS

## 2019-09-03 MED ORDER — SODIUM CHLORIDE 0.9% FLUSH
10.0000 mL | INTRAVENOUS | Status: DC | PRN
  Administered 2019-09-03: 10 mL
  Filled 2019-09-03: qty 10

## 2019-09-03 MED ORDER — SODIUM CHLORIDE 0.9 % IV SOLN
Freq: Once | INTRAVENOUS | Status: AC
  Administered 2019-09-03: 11:00:00 via INTRAVENOUS
  Filled 2019-09-03: qty 5

## 2019-09-03 MED ORDER — SODIUM CHLORIDE 0.9 % IV SOLN
Freq: Once | INTRAVENOUS | Status: AC
  Administered 2019-09-03: 11:00:00 via INTRAVENOUS
  Filled 2019-09-03: qty 250

## 2019-09-03 MED ORDER — SODIUM CHLORIDE 0.9% FLUSH
10.0000 mL | INTRAVENOUS | Status: DC | PRN
  Administered 2019-09-03: 10:00:00 10 mL via INTRAVENOUS
  Filled 2019-09-03: qty 10

## 2019-09-03 NOTE — Progress Notes (Signed)
Hematology and Oncology Follow Up Visit  Colleen Hughes [id number] 1935/11/18 83 y.o. 09/03/2019 10:06 AM Colleen Hughes, MDGriffin, Colleen Reichmann, MD   Principle Diagnosis: 83 year old woman with bladder cancer diagnosed in 2019.  She subsequently developed stage IV disease with pelvic metastasis. Prior Therapy:  Robotic assisted laparoscopic cystectomy with ileal conduit formation.  She is also bilateral lymphadenectomy completed on July 24, 2018.  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  Current therapy: Carboplatin and gemcitabine started on 06/11/2019.  She is here for cycle 5 of therapy.  Interim History: Ms. Colleen Hughes presents today for a follow-up.  Since the last visit, she continues to do well and tolerate chemotherapy without any complaints.  She denies any nausea, vomiting or weight loss.  Her appetite is excellent and has gained weight.  She denies any recent hospitalization or illnesses.  She is ambulating inside her house without any walker or cane.  She denies any recent falls or syncope.  She denies any pelvic discomfort.    She denied any alteration mental status, neuropathy, confusion or dizziness.  Denies any headaches or lethargy.  Denies any night sweats, weight loss or changes in appetite.  Denied orthopnea, dyspnea on exertion or chest discomfort.  Denies shortness of breath, difficulty breathing hemoptysis or cough.  Denies any abdominal distention, nausea, early satiety or dyspepsia.  Denies any hematuria, frequency, dysuria or nocturia.  Denies any skin irritation, dryness or rash.  Denies any ecchymosis or petechiae.  Denies any lymphadenopathy or clotting.  Denies any heat or cold intolerance.  Denies any anxiety or depression.  Remaining review of system is negative.      .         Medications: Without any changes on review. Current Outpatient Medications   Medication Sig Dispense Refill  . acetaminophen (TYLENOL) 500 MG tablet Take 500-1,000 mg by mouth every 6 (six) hours as needed for moderate pain or headache.     Marland Kitchen aspirin EC 81 MG tablet Take 81 mg by mouth daily.    . Calcium-Phosphorus-Vitamin D (CITRACAL +D3 PO) Take 2 tablets by mouth daily.    . Cholecalciferol (VITAMIN D3) 2000 units TABS Take 2,000 Units by mouth daily.    . megestrol (MEGACE) 40 MG/ML suspension TAKE 10 MLS (400 MG TOTAL) BY MOUTH 2 (TWO) TIMES DAILY. 240 mL 0  . Multiple Vitamin (MULTIVITAMIN WITH MINERALS) TABS tablet Take 1 tablet by mouth daily. [employer] Multivitamin    . prochlorperazine (COMPAZINE) 10 MG tablet Take 1 tablet (10 mg total) by mouth every 6 (six) hours as needed for nausea or vomiting. 30 tablet 0  . traMADol (ULTRAM) 50 MG tablet Take 1-2 tablets (50-100 mg total) by mouth every 6 (six) hours as needed for moderate pain or severe pain. Post-operatively 20 tablet 0  . triamterene-hydrochlorothiazide (MAXZIDE-25) 37.5-25 MG tablet Take 1 tablet by mouth daily.     No current facility-administered medications for this visit.      Allergies:  Allergies  Allergen Reactions  . Codeine Nausea Only    Past Medical History, Surgical history, Social history, and Family History unchanged on review.   Physical Exam:  Blood pressure 140/78, pulse 87, temperature 99.2 F (37.3 C), temperature source Temporal, resp. rate 18, height 5\' 6"  (1.676 m), weight 117 lb 12.8 oz (53.4 kg), SpO2 100 %.      ECOG: 1     General appearance: Alert,  awake without any distress. Head: Atraumatic without abnormalities Oropharynx: Without any thrush or ulcers. Eyes: No scleral icterus. Lymph nodes: No lymphadenopathy noted in the cervical, supraclavicular, or axillary nodes Heart:regular rate and rhythm, without any murmurs or gallops.   Lung: Clear to auscultation without any rhonchi, wheezes or dullness to percussion. Abdomin: Soft, nontender  without any shifting dullness or ascites. Musculoskeletal: No clubbing or cyanosis. Neurological: No motor or sensory deficits. Skin: No rashes or lesions.          Lab Results: Lab Results  Component Value Date   WBC 4.8 09/03/2019   HGB 9.0 (L) 09/03/2019   HCT 29.0 (L) 09/03/2019   MCV 105.1 (H) 09/03/2019   PLT 207 09/03/2019     Chemistry      Component Value Date/Time   NA 141 08/12/2019 0958   K 4.3 08/12/2019 0958   CL 114 (H) 08/12/2019 0958   CO2 19 (L) 08/12/2019 0958   BUN 32 (H) 08/12/2019 0958   CREATININE 1.32 (H) 08/12/2019 0958      Component Value Date/Time   CALCIUM 8.6 (L) 08/12/2019 0958   ALKPHOS 61 08/12/2019 0958   AST 14 (L) 08/12/2019 0958   ALT 10 08/12/2019 0958   BILITOT 0.2 (L) 08/12/2019 0958      IMPRESSION: 1. Redemonstrated postoperative findings of cystectomy and right lower quadrant ileal conduit urinary diversion.  2. There is a new lytic lesion of the right pubis at the pubic symphysis about previously seen fractures (series 2, image 104). An adjacent rounded soft tissue nodule is decreased in size, measuring 3.2 x 2.8 cm, previously 3.7 x 3.1 cm (series 2, image 102). Findings remain concerning for metastatic lesion, although improved compared to prior examination. Contrast administration would be helpful to assess for enhancing soft tissue.  3.  No other non-contrast CT evidence of metastatic disease.  4. There is new sclerosis about an angulated fracture of the S3 sacral segment (series 7, image 67). There is extensive new sclerosis about bilateral sacral ala fractures (series 8, image 9). No discrete metastatic lesions are appreciated.  5.  Aortic Atherosclerosis (ICD10-I70.0).     Impression and Plan:  83 year old woman with:  1.  Bladder cancer diagnosed in 2019.  She subsequently developed stage IV disease with pelvic involvement.  She is currently receiving palliative chemotherapy with excellent  tolerance at this time.  CT scan obtained on August 15, 2019 was personally reviewed and showed reasonable response to therapy.  Risks and benefits of continuing chemotherapy was reviewed today and she is agreeable to continue.  Plan is to complete 6 cycles of therapy and repeat imaging studies after that.    2.  Renal insufficiency: Kidney function remains stable at this time.  We will continue to monitor on platinum therapy.  3.  Anemia: Related to chemotherapy and malignancy.  Hemoglobin is stable and does not require any intervention.  4.  Anorexia: No issues reported and continues to gain weight.  5.  IV access: Port-A-Cath currently in use without any issues.  6.  Antiemetics: No nausea or vomiting reported and Compazine is available to her.  7.  Follow-up: She will return in 3 weeks for the next cycle of therapy.  25  minutes was spent with the patient face-to-face today.  More than 50% of time was spent on updating her disease status, reviewing imaging studies and coordinating plan of care for the future.    Zola Button, MD 11/18/202010:06 AM

## 2019-09-03 NOTE — Patient Instructions (Signed)
[facility] Discharge Instructions for Patients Receiving Chemotherapy  Today you received the following chemotherapy agents: Gemzar, Carboplatin   To help prevent nausea and vomiting after your treatment, we encourage you to take your nausea medication as directed.   If you develop nausea and vomiting that is not controlled by your nausea medication, call the clinic.   BELOW ARE SYMPTOMS THAT SHOULD BE REPORTED IMMEDIATELY:  *FEVER GREATER THAN 100.5 F  *CHILLS WITH OR WITHOUT FEVER  NAUSEA AND VOMITING THAT IS NOT CONTROLLED WITH YOUR NAUSEA MEDICATION  *UNUSUAL SHORTNESS OF BREATH  *UNUSUAL BRUISING OR BLEEDING  TENDERNESS IN MOUTH AND THROAT WITH OR WITHOUT PRESENCE OF ULCERS  *URINARY PROBLEMS  *BOWEL PROBLEMS  UNUSUAL RASH Items with * indicate a potential emergency and should be followed up as soon as possible.  Feel free to call the clinic should you have any questions or concerns. The clinic phone number is [phone].  Please show the CHEMO ALERT CARD at check-in to the Emergency Department and triage nurse.   

## 2019-09-04 ENCOUNTER — Telehealth: Payer: Self-pay | Admitting: Oncology

## 2019-09-04 NOTE — Telephone Encounter (Signed)
No los per 11/18.

## 2019-09-10 ENCOUNTER — Other Ambulatory Visit: Payer: Self-pay | Admitting: Oncology

## 2019-09-23 ENCOUNTER — Inpatient Hospital Stay (HOSPITAL_BASED_OUTPATIENT_CLINIC_OR_DEPARTMENT_OTHER): Payer: Medicare Other | Admitting: Oncology

## 2019-09-23 ENCOUNTER — Inpatient Hospital Stay: Payer: Medicare Other | Attending: Oncology

## 2019-09-23 ENCOUNTER — Inpatient Hospital Stay: Payer: Medicare Other

## 2019-09-23 ENCOUNTER — Other Ambulatory Visit: Payer: Self-pay

## 2019-09-23 VITALS — BP 146/94 | HR 97 | Temp 98.1°F | Resp 18 | Wt 124.0 lb

## 2019-09-23 VITALS — BP 152/86

## 2019-09-23 DIAGNOSIS — C679 Malignant neoplasm of bladder, unspecified: Secondary | ICD-10-CM | POA: Insufficient documentation

## 2019-09-23 DIAGNOSIS — Z5111 Encounter for antineoplastic chemotherapy: Secondary | ICD-10-CM | POA: Diagnosis not present

## 2019-09-23 DIAGNOSIS — C678 Malignant neoplasm of overlapping sites of bladder: Secondary | ICD-10-CM | POA: Diagnosis not present

## 2019-09-23 DIAGNOSIS — C775 Secondary and unspecified malignant neoplasm of intrapelvic lymph nodes: Secondary | ICD-10-CM | POA: Diagnosis not present

## 2019-09-23 LAB — CBC WITH DIFFERENTIAL (CANCER CENTER ONLY)
Abs Immature Granulocytes: 0.07 10*3/uL (ref 0.00–0.07)
Basophils Absolute: 0 10*3/uL (ref 0.0–0.1)
Basophils Relative: 0 %
Eosinophils Absolute: 0 10*3/uL (ref 0.0–0.5)
Eosinophils Relative: 1 %
HCT: 28 % — ABNORMAL LOW (ref 36.0–46.0)
Hemoglobin: 8.8 g/dL — ABNORMAL LOW (ref 12.0–15.0)
Immature Granulocytes: 1 %
Lymphocytes Relative: 54 %
Lymphs Abs: 2.7 10*3/uL (ref 0.7–4.0)
MCH: 33.7 pg (ref 26.0–34.0)
MCHC: 31.4 g/dL (ref 30.0–36.0)
MCV: 107.3 fL — ABNORMAL HIGH (ref 80.0–100.0)
Monocytes Absolute: 0.6 10*3/uL (ref 0.1–1.0)
Monocytes Relative: 12 %
Neutro Abs: 1.7 10*3/uL (ref 1.7–7.7)
Neutrophils Relative %: 32 %
Platelet Count: 187 10*3/uL (ref 150–400)
RBC: 2.61 MIL/uL — ABNORMAL LOW (ref 3.87–5.11)
RDW: 18.2 % — ABNORMAL HIGH (ref 11.5–15.5)
WBC Count: 5.1 10*3/uL (ref 4.0–10.5)
nRBC: 0 % (ref 0.0–0.2)

## 2019-09-23 LAB — CMP (CANCER CENTER ONLY)
ALT: 12 U/L (ref 0–44)
AST: 18 U/L (ref 15–41)
Albumin: 3.7 g/dL (ref 3.5–5.0)
Alkaline Phosphatase: 55 U/L (ref 38–126)
Anion gap: 9 (ref 5–15)
BUN: 26 mg/dL — ABNORMAL HIGH (ref 8–23)
CO2: 21 mmol/L — ABNORMAL LOW (ref 22–32)
Calcium: 8.8 mg/dL — ABNORMAL LOW (ref 8.9–10.3)
Chloride: 111 mmol/L (ref 98–111)
Creatinine: 1.29 mg/dL — ABNORMAL HIGH (ref 0.44–1.00)
GFR, Est AFR Am: 45 mL/min — ABNORMAL LOW (ref >60)
GFR, Estimated: 39 mL/min — ABNORMAL LOW (ref >60)
Glucose, Bld: 102 mg/dL — ABNORMAL HIGH (ref 70–99)
Potassium: 4.4 mmol/L (ref 3.5–5.1)
Sodium: 141 mmol/L (ref 135–145)
Total Bilirubin: 0.3 mg/dL (ref 0.3–1.2)
Total Protein: 6.6 g/dL (ref 6.5–8.1)

## 2019-09-23 MED ORDER — SODIUM CHLORIDE 0.9 % IV SOLN
259.5000 mg | Freq: Once | INTRAVENOUS | Status: AC
  Administered 2019-09-23: 260 mg via INTRAVENOUS
  Filled 2019-09-23: qty 26

## 2019-09-23 MED ORDER — SODIUM CHLORIDE 0.9 % IV SOLN
1000.0000 mg/m2 | Freq: Once | INTRAVENOUS | Status: AC
  Administered 2019-09-23: 1482 mg via INTRAVENOUS
  Filled 2019-09-23: qty 38.98

## 2019-09-23 MED ORDER — PALONOSETRON HCL INJECTION 0.25 MG/5ML
INTRAVENOUS | Status: AC
  Filled 2019-09-23: qty 5

## 2019-09-23 MED ORDER — HEPARIN SOD (PORK) LOCK FLUSH 100 UNIT/ML IV SOLN
500.0000 [IU] | Freq: Once | INTRAVENOUS | Status: AC | PRN
  Administered 2019-09-23: 500 [IU]
  Filled 2019-09-23: qty 5

## 2019-09-23 MED ORDER — SODIUM CHLORIDE 0.9 % IV SOLN
Freq: Once | INTRAVENOUS | Status: AC
  Administered 2019-09-23: 10:00:00 via INTRAVENOUS
  Filled 2019-09-23: qty 5

## 2019-09-23 MED ORDER — SODIUM CHLORIDE 0.9 % IV SOLN
Freq: Once | INTRAVENOUS | Status: AC
  Administered 2019-09-23: 09:00:00 via INTRAVENOUS
  Filled 2019-09-23: qty 250

## 2019-09-23 MED ORDER — PALONOSETRON HCL INJECTION 0.25 MG/5ML
0.2500 mg | Freq: Once | INTRAVENOUS | Status: AC
  Administered 2019-09-23: 0.25 mg via INTRAVENOUS

## 2019-09-23 MED ORDER — SODIUM CHLORIDE 0.9% FLUSH
10.0000 mL | INTRAVENOUS | Status: DC | PRN
  Administered 2019-09-23: 10 mL
  Filled 2019-09-23: qty 10

## 2019-09-23 NOTE — Progress Notes (Signed)
Hematology and Oncology Follow Up Visit  ARLEIGH DERKS [id number] 06/25/1936 83 y.o. 09/23/2019 8:29 AM [name], MDGriffin, [name], MD   Principle Diagnosis: 83 year old woman with stage IV bladder cancer with pelvic relapse documented in 2020.  She was initially diagnosed with localized disease in 2019.   Prior Therapy:  Robotic assisted laparoscopic cystectomy with ileal conduit formation.  She is also bilateral lymphadenectomy completed on July 24, 2018.  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  Current therapy: Carboplatin and gemcitabine started on 06/11/2019.  She is here for cycle 6 of therapy.  Interim History: Ms. Coones returns today for a repeat evaluation.  Since the last visit, she continues to feel well without any changes in her health.  She continues to tolerate chemotherapy without any recent complaints.  She denies any nausea, vomiting or changes in her bowels.  She denies any pelvic pain or discomfort.  She continues to eat well and has gained weight.  She denies any recent falls or syncope.   Patient denied any alteration mental status, neuropathy, confusion or dizziness.  Denies any headaches or lethargy.  Denies any night sweats, weight loss or changes in appetite.  Denied orthopnea, dyspnea on exertion or chest discomfort.  Denies shortness of breath, difficulty breathing hemoptysis or cough.  Denies any abdominal distention, nausea, early satiety or dyspepsia.  Denies any hematuria, frequency, dysuria or nocturia.  Denies any skin irritation, dryness or rash.  Denies any ecchymosis or petechiae.  Denies any lymphadenopathy or clotting.  Denies any heat or cold intolerance.  Denies any anxiety or depression.  Remaining review of system is negative.         .         Medications: Unchanged on review. Current Outpatient Medications  Medication Sig  Dispense Refill  . acetaminophen (TYLENOL) 500 MG tablet Take 500-1,000 mg by mouth every 6 (six) hours as needed for moderate pain or headache.     Marland Kitchen aspirin EC 81 MG tablet Take 81 mg by mouth daily.    . Calcium-Phosphorus-Vitamin D (CITRACAL +D3 PO) Take 2 tablets by mouth daily.    . Cholecalciferol (VITAMIN D3) 2000 units TABS Take 2,000 Units by mouth daily.    . megestrol (MEGACE) 40 MG/ML suspension TAKE 10 MLS (400 MG TOTAL) BY MOUTH 2 (TWO) TIMES DAILY. 240 mL 0  . Multiple Vitamin (MULTIVITAMIN WITH MINERALS) TABS tablet Take 1 tablet by mouth daily. [employer] Multivitamin    . prochlorperazine (COMPAZINE) 10 MG tablet Take 1 tablet (10 mg total) by mouth every 6 (six) hours as needed for nausea or vomiting. 30 tablet 0  . traMADol (ULTRAM) 50 MG tablet Take 1-2 tablets (50-100 mg total) by mouth every 6 (six) hours as needed for moderate pain or severe pain. Post-operatively 20 tablet 0  . triamterene-hydrochlorothiazide (MAXZIDE-25) 37.5-25 MG tablet Take 1 tablet by mouth daily.     No current facility-administered medications for this visit.      Allergies:  Allergies  Allergen Reactions  . Codeine Nausea Only    Past Medical History, Surgical history, Social history, and Family History updated without any changes.   Physical Exam:   Blood pressure (!) 146/94, pulse 97, temperature 98.1 F (36.7 C), temperature source Temporal, resp. rate 18, weight 124 lb (56.2 kg), SpO2 100 %.      ECOG: 1    General appearance: Comfortable appearing without  any discomfort Head: Normocephalic without any trauma Oropharynx: Mucous membranes are moist and pink without any thrush or ulcers. Eyes: Pupils are equal and round reactive to light. Lymph nodes: No cervical, supraclavicular, inguinal or axillary lymphadenopathy.   Heart:regular rate and rhythm.  S1 and S2 without leg edema. Lung: Clear without any rhonchi or wheezes.  No dullness to percussion. Abdomin: Soft,  nontender, nondistended with good bowel sounds.  No hepatosplenomegaly. Musculoskeletal: No joint deformity or effusion.  Full range of motion noted. Neurological: No deficits noted on motor, sensory and deep tendon reflex exam. Skin: No petechial rash or dryness.  Appeared moist.            Lab Results: Lab Results  Component Value Date   WBC 4.8 09/03/2019   HGB 9.0 (L) 09/03/2019   HCT 29.0 (L) 09/03/2019   MCV 105.1 (H) 09/03/2019   PLT 207 09/03/2019     Chemistry      Component Value Date/Time   NA 141 09/03/2019 0940   K 4.8 09/03/2019 0940   CL 112 (H) 09/03/2019 0940   CO2 19 (L) 09/03/2019 0940   BUN 30 (H) 09/03/2019 0940   CREATININE 1.42 (H) 09/03/2019 0940      Component Value Date/Time   CALCIUM 8.9 09/03/2019 0940   ALKPHOS 56 09/03/2019 0940   AST 15 09/03/2019 0940   ALT 11 09/03/2019 0940   BILITOT 0.2 (L) 09/03/2019 0940          Impression and Plan:  83 year old woman with:  1.  Stage IV bladder cancer with pelvic metastasis as noted in August of 2020.    She continues to tolerate palliative chemotherapy without any major complications at this time.  Risks and benefits of completing 6 cycles of therapy was reviewed at this time.  The natural course of this disease as well as management options beyond this cycle was reviewed.  I have recommended to repeat imaging studies after the sixth cycle and consideration for switch maintenance with immunotherapy moving forward.  She is agreeable with this plan.    2.  Renal insufficiency: Creatinine clearance is overall stable we will continue to monitor on platinum therapy.  3.  Anemia: Hemoglobin relatively stable does not require any intervention.  This is related to malignancy as well as chemotherapy.  4.  Anorexia: Improved over a period of time and continues to gain weight.  5.  IV access: Port-A-Cath remains in place and in use without any issues.  6.  Antiemetics: Compazine is available  to her without any recent nausea or vomiting.  7.  Follow-up: We will be in 4 weeks for repeat imaging studies.  25  minutes was spent with the patient face-to-face today.  More than 50% of time was dedicated to reviewing her disease status, treatment options as well as outlining future plan of care.    Zola Button, MD 12/8/20208:29 AM

## 2019-09-24 ENCOUNTER — Telehealth: Payer: Self-pay | Admitting: Oncology

## 2019-09-24 NOTE — Telephone Encounter (Signed)
Scheduled per los. Called and spoke with patient. Confirmed appt 

## 2019-10-04 ENCOUNTER — Other Ambulatory Visit: Payer: Self-pay | Admitting: Oncology

## 2019-10-24 ENCOUNTER — Inpatient Hospital Stay: Payer: Medicare Other

## 2019-10-24 ENCOUNTER — Inpatient Hospital Stay: Payer: Medicare Other | Attending: Oncology

## 2019-10-24 ENCOUNTER — Ambulatory Visit (HOSPITAL_COMMUNITY)
Admission: RE | Admit: 2019-10-24 | Discharge: 2019-10-24 | Disposition: A | Discharge status: Home / Self Care | Payer: Medicare Other | Source: Ambulatory Visit | Attending: Oncology | Admitting: Oncology

## 2019-10-24 ENCOUNTER — Other Ambulatory Visit: Payer: Self-pay

## 2019-10-24 DIAGNOSIS — C775 Secondary and unspecified malignant neoplasm of intrapelvic lymph nodes: Secondary | ICD-10-CM | POA: Insufficient documentation

## 2019-10-24 DIAGNOSIS — C7989 Secondary malignant neoplasm of other specified sites: Secondary | ICD-10-CM | POA: Diagnosis not present

## 2019-10-24 DIAGNOSIS — C679 Malignant neoplasm of bladder, unspecified: Secondary | ICD-10-CM | POA: Diagnosis present

## 2019-10-24 DIAGNOSIS — D6481 Anemia due to antineoplastic chemotherapy: Secondary | ICD-10-CM | POA: Insufficient documentation

## 2019-10-24 DIAGNOSIS — C678 Malignant neoplasm of overlapping sites of bladder: Secondary | ICD-10-CM | POA: Insufficient documentation

## 2019-10-24 DIAGNOSIS — Z5111 Encounter for antineoplastic chemotherapy: Secondary | ICD-10-CM | POA: Diagnosis present

## 2019-10-24 DIAGNOSIS — Z95828 Presence of other vascular implants and grafts: Secondary | ICD-10-CM

## 2019-10-24 LAB — CBC WITH DIFFERENTIAL (CANCER CENTER ONLY)
Abs Immature Granulocytes: 0.06 10*3/uL (ref 0.00–0.07)
Basophils Absolute: 0 10*3/uL (ref 0.0–0.1)
Basophils Relative: 0 %
Eosinophils Absolute: 0 10*3/uL (ref 0.0–0.5)
Eosinophils Relative: 1 %
HCT: 29.9 % — ABNORMAL LOW (ref 36.0–46.0)
Hemoglobin: 9.5 g/dL — ABNORMAL LOW (ref 12.0–15.0)
Immature Granulocytes: 1 %
Lymphocytes Relative: 23 %
Lymphs Abs: 1.3 10*3/uL (ref 0.7–4.0)
MCH: 33.7 pg (ref 26.0–34.0)
MCHC: 31.8 g/dL (ref 30.0–36.0)
MCV: 106 fL — ABNORMAL HIGH (ref 80.0–100.0)
Monocytes Absolute: 0.7 10*3/uL (ref 0.1–1.0)
Monocytes Relative: 12 %
Neutro Abs: 3.6 10*3/uL (ref 1.7–7.7)
Neutrophils Relative %: 63 %
Platelet Count: 209 10*3/uL (ref 150–400)
RBC: 2.82 MIL/uL — ABNORMAL LOW (ref 3.87–5.11)
RDW: 15.4 % (ref 11.5–15.5)
WBC Count: 5.7 10*3/uL (ref 4.0–10.5)
nRBC: 0 % (ref 0.0–0.2)

## 2019-10-24 LAB — CMP (CANCER CENTER ONLY)
ALT: 9 U/L (ref 0–44)
AST: 18 U/L (ref 15–41)
Albumin: 4 g/dL (ref 3.5–5.0)
Alkaline Phosphatase: 55 U/L (ref 38–126)
Anion gap: 11 (ref 5–15)
BUN: 23 mg/dL (ref 8–23)
CO2: 19 mmol/L — ABNORMAL LOW (ref 22–32)
Calcium: 8.8 mg/dL — ABNORMAL LOW (ref 8.9–10.3)
Chloride: 110 mmol/L (ref 98–111)
Creatinine: 1.27 mg/dL — ABNORMAL HIGH (ref 0.44–1.00)
GFR, Est AFR Am: 46 mL/min — ABNORMAL LOW (ref >60)
GFR, Estimated: 40 mL/min — ABNORMAL LOW (ref >60)
Glucose, Bld: 88 mg/dL (ref 70–99)
Potassium: 4.3 mmol/L (ref 3.5–5.1)
Sodium: 140 mmol/L (ref 135–145)
Total Bilirubin: 0.3 mg/dL (ref 0.3–1.2)
Total Protein: 7.1 g/dL (ref 6.5–8.1)

## 2019-10-24 MED ORDER — SODIUM CHLORIDE 0.9% FLUSH
10.0000 mL | INTRAVENOUS | Status: DC | PRN
  Administered 2019-10-24: 10:00:00 10 mL via INTRAVENOUS
  Filled 2019-10-24: qty 10

## 2019-10-24 MED ORDER — HEPARIN SOD (PORK) LOCK FLUSH 100 UNIT/ML IV SOLN
500.0000 [IU] | Freq: Once | INTRAVENOUS | Status: AC
  Administered 2019-10-24: 500 [IU] via INTRAVENOUS

## 2019-10-24 MED ORDER — HEPARIN SOD (PORK) LOCK FLUSH 100 UNIT/ML IV SOLN
INTRAVENOUS | Status: AC
  Filled 2019-10-24: qty 5

## 2019-10-24 NOTE — Patient Instructions (Signed)

## 2019-10-31 ENCOUNTER — Inpatient Hospital Stay (HOSPITAL_BASED_OUTPATIENT_CLINIC_OR_DEPARTMENT_OTHER): Payer: Medicare Other | Admitting: Oncology

## 2019-10-31 DIAGNOSIS — C678 Malignant neoplasm of overlapping sites of bladder: Secondary | ICD-10-CM

## 2019-10-31 NOTE — Progress Notes (Signed)
Hematology and Oncology Follow Up for Telemedicine Visits  AARON BRISON [id number] 01/19/1936 84 y.o. 10/31/2019 3:11 PM Colleen Hughes, MDGriffin, Colleen Reichmann, MD   I connected with Colleen Hughes on 10/31/19 at  3:45 PM EST by telephone visit and verified that I am speaking with the correct person using two identifiers.   I discussed the limitations, risks, security and privacy concerns of performing an evaluation and management service by telemedicine and the availability of in-person appointments. I also discussed with the patient that there may be a patient responsible charge related to this service. The patient expressed understanding and agreed to proceed.  Other persons participating in the visit and their role in the encounter:    [name]:  Home Provider's location:  Office    Principle Diagnosis:  84 year old woman with stage IV bladder cancer with pelvic relapse documented in 2020.  She was initially diagnosed with localized disease in 2019.   Prior Therapy:  Robotic assisted laparoscopic cystectomy with ileal conduit formation.  She is also bilateral lymphadenectomy completed on July 24, 2018.  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  Carboplatin and gemcitabine started on 06/11/2019.  She is here for cycle 6 of therapy.  Current therapy:  Posttreatment active surveillance.   Interim History: Colleen Hughes reports few complaints that occurred in the last week.  She is reporting more arthralgias and myalgias and occasional cough although she is not reporting any fevers or shortness of breath.  She does report generalized weakness and some occasional back pain but still ambulating without any major difficulties.  Her appetite has decreased at this time.    Allergies:  Allergies  Allergen Reactions  . Codeine Nausea Only      Lab Results: Lab Results   Component Value Date   WBC 5.7 10/24/2019   HGB 9.5 (L) 10/24/2019   HCT 29.9 (L) 10/24/2019   MCV 106.0 (H) 10/24/2019   PLT 209 10/24/2019     Chemistry      Component Value Date/Time   NA 140 10/24/2019 1005   K 4.3 10/24/2019 1005   CL 110 10/24/2019 1005   CO2 19 (L) 10/24/2019 1005   BUN 23 10/24/2019 1005   CREATININE 1.27 (H) 10/24/2019 1005      Component Value Date/Time   CALCIUM 8.8 (L) 10/24/2019 1005   ALKPHOS 55 10/24/2019 1005   AST 18 10/24/2019 1005   ALT 9 10/24/2019 1005   BILITOT 0.3 10/24/2019 1005       Radiological Studies: IMPRESSION: 1. New soft tissue nodule along the peripheral right lower lobe, concerning for metastatic disease. 2. Destructive changes about the pubic symphysis with associated soft tissue nodule that shows diminished size. 3. Areas of bony sclerosis in the ribs, some clearly associated with old fractures others raising the question of rib involvement, for instance long segment involvement of the left fourth rib. 4. Signs of sacral insufficiency fracture with angulated component at S3 showing similar appearance to the prior study. 5. T3 compression fracture new since January quite subtle. 6. Signs of urinary conduit diversion via right lower quadrant ostomy. Mild right distal ureteral distension proximal to anastomotic level is unchanged. 7. Pelvic floor dysfunction with laxity and small-bowel prolapse. 8. T3 compression fracture very mild new since January of 2020. 9. Aortic atherosclerosis. 10. Signs of colonic diverticulosis without evidence of diverticulitis. 11. Signs of bilateral breast implants with peripheral calcification as  seen on prior studies  Impression and Plan:  84 year old woman with:  1.    Bladder cancer diagnosed in 2019 and subsequently developed stage IV disease in August 2020.   She is status post 6 cycles of chemotherapy outlined above which she has tolerated reasonably well.  CT scan  obtained on  October 24, 2019 was personally reviewed and discussed with the patient today.  Overall the CT scan showed reasonable response to therapy without any clear-cut disease progression.  She did have a pulmonary nodule that will be monitored on future scans.   Alcohol Case in I recommended continued active surveillance at this time and restart different salvage therapy if she has worsening progression of disease in the future.  Pembrolizumab would be a reasonable option if you develop documented metastatic disease.    2.  Renal insufficiency: Kidney function remains stable at this time.  3.  Anemia: Related to chemotherapy and has improved after discontinuation of treatment.  4.  IV access: Port-A-Cath will be flushed periodically between visits.     5.  Cough and myalgias: I recommended follow-up with her primary care physician regarding evaluation and possible Covid 19 testing versus treatment for possible bronchitis.  I do not see any correlation between the symptoms and her cancer cancer therapy.   6.  Follow-up: She will return in 6 weeks for a Port-A-Cath flush and rescan in 3 months.   I discussed the assessment and treatment plan with the patient. The patient was provided an opportunity to ask questions and all were answered. The patient agreed with the plan and demonstrated an understanding of the instructions.   The patient was advised to call back or seek an in-person evaluation if the symptoms worsen or if the condition fails to improve as anticipated.  I provided 20 minutes of non face-to-face telephone visit time during this encounter, and > 50% was dedicated to reviewing imaging studies, laboratory data as well as answering questions regarding future plan of care.  Zola Button, MD 10/31/2019 3:11 PM

## 2019-11-03 ENCOUNTER — Telehealth: Payer: Self-pay | Admitting: Oncology

## 2019-11-03 NOTE — Telephone Encounter (Signed)
Scheduled appt per 1/15 los.  Sent a message to HIM pool to get a calendar mailed out. 

## 2019-11-06 ENCOUNTER — Telehealth: Payer: Self-pay | Admitting: Internal Medicine

## 2019-11-12 ENCOUNTER — Ambulatory Visit: Payer: Medicare Other

## 2019-11-17 NOTE — Telephone Encounter (Signed)
Patient son wanted to Dr. Mickeal Skinner know that the patient died today -2019/11/07 at 4:00a.

## 2019-11-17 DEATH — deceased

## 2020-01-29 ENCOUNTER — Other Ambulatory Visit: Payer: Medicare Other

## 2020-02-05 ENCOUNTER — Ambulatory Visit: Payer: Medicare Other | Admitting: Oncology

## 2020-06-03 IMAGING — CT CT ABD-PELV W/O CM
2 of 5 series · 12 of 36 positions shown, 15 images · non-contrast
Comparison: 05/28/2019, 02/13/2019

CLINICAL DATA: Bladder cancer

EXAM:
CT CHEST, ABDOMEN AND PELVIS WITHOUT CONTRAST
TECHNIQUE: Multidetector CT imaging of the chest, abdomen and pelvis was
performed following the standard protocol without IV contrast. Oral
enteric contrast was administered.

[Series 2: cap w/o · axial · non-contrast · 0.63mm/px · z∈[-435,+15]mm · 9 of 111 slices shown, 12 images]
[im 11/111  mediastinal]
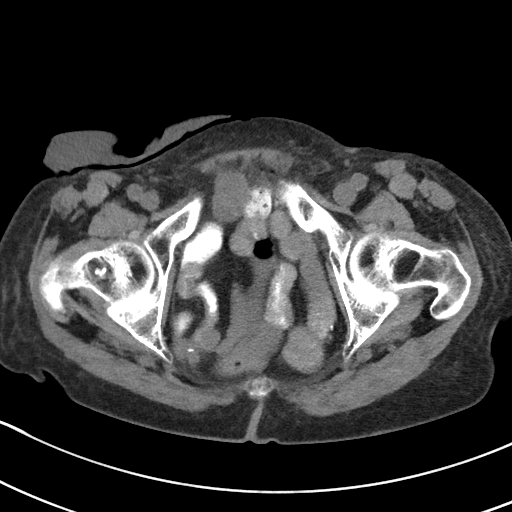
[im 11/111  lung]
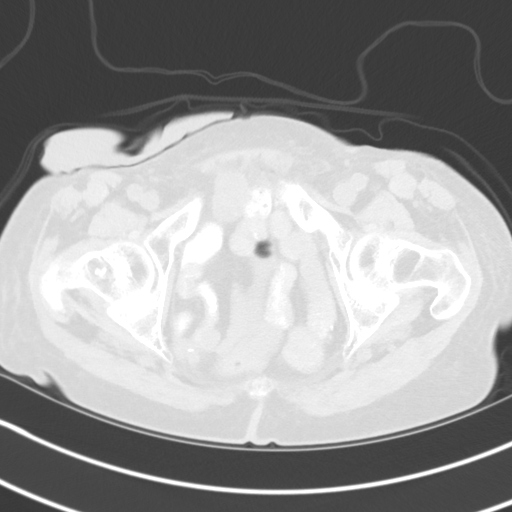
[im 21/111  lung]
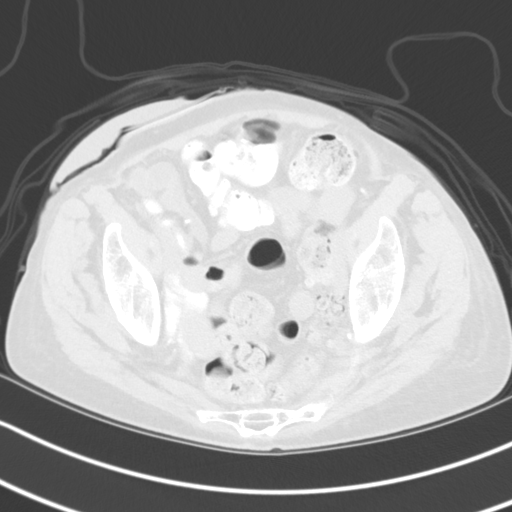
[im 31/111  lung]
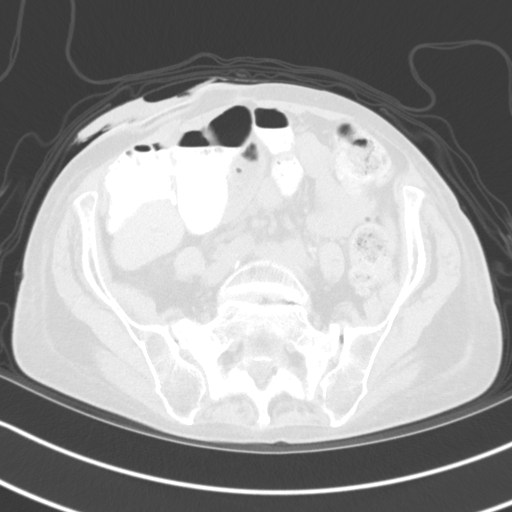
[im 41/111  lung]
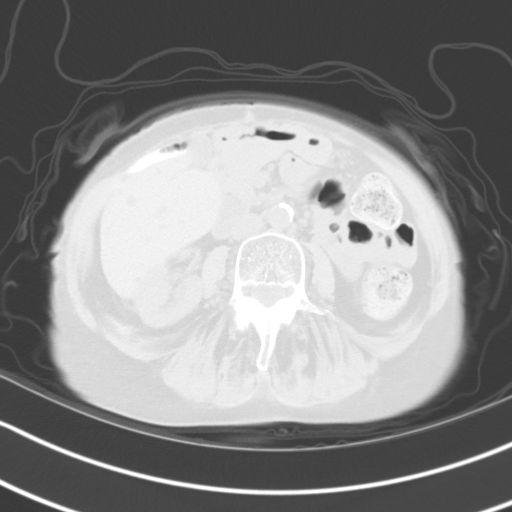
[im 61/111  mediastinal]
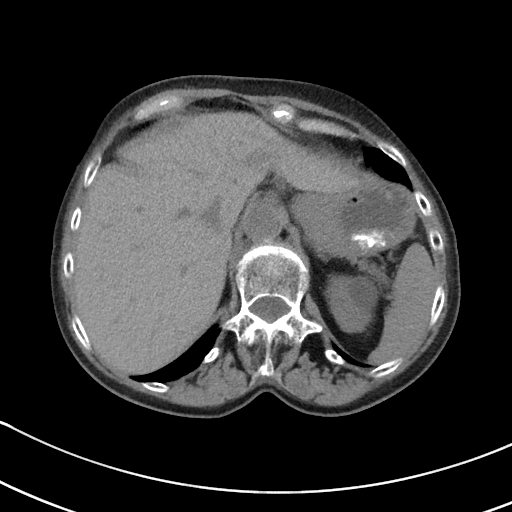
[im 61/111  lung]
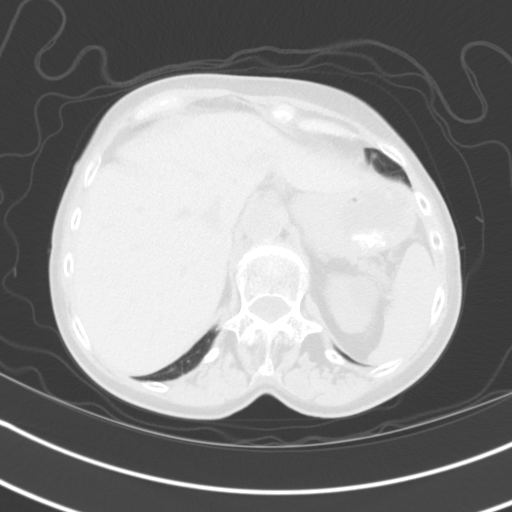
[im 71/111  lung]
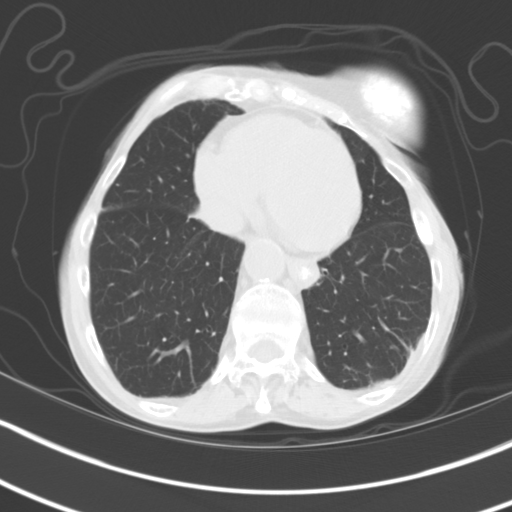
[im 81/111  lung]
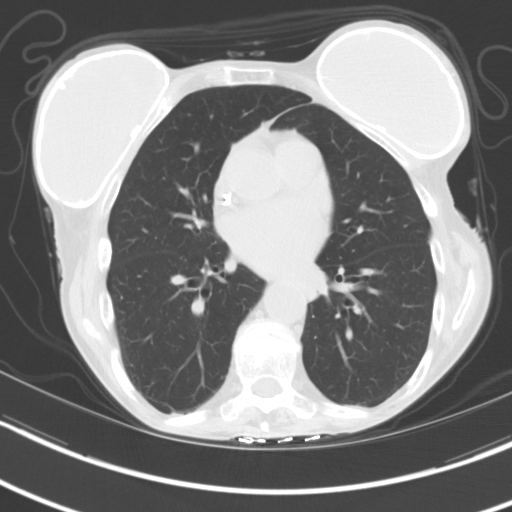
[im 91/111  lung]
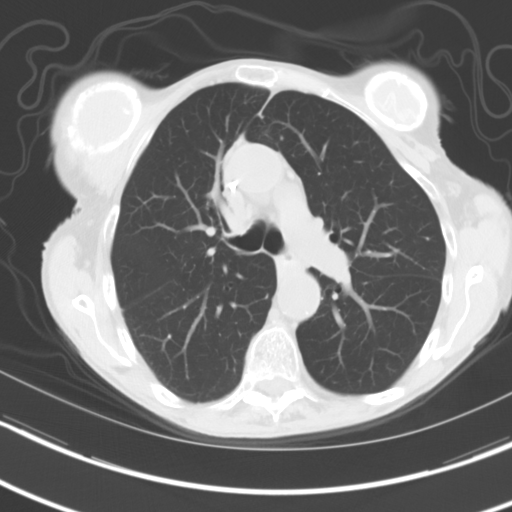
[im 101/111  mediastinal]
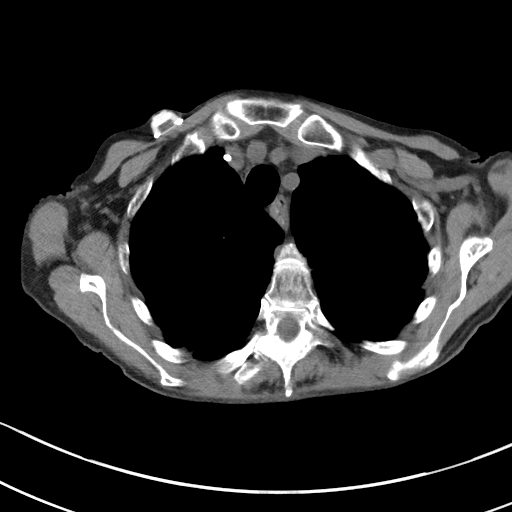
[im 101/111  lung]
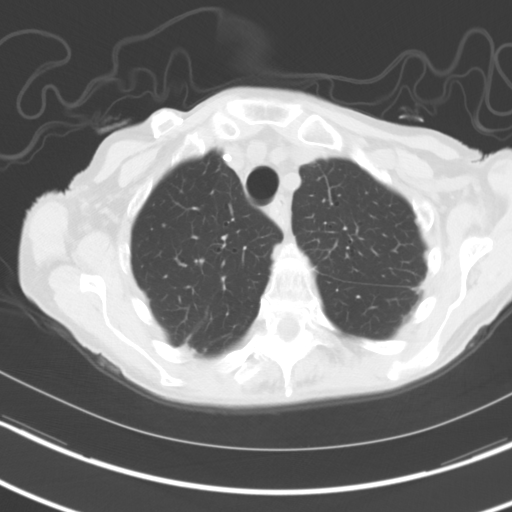

[Series 8: coronals · coronal · 0.56mm/px · 3 of 117 slices shown]
[im 24/117  lung]
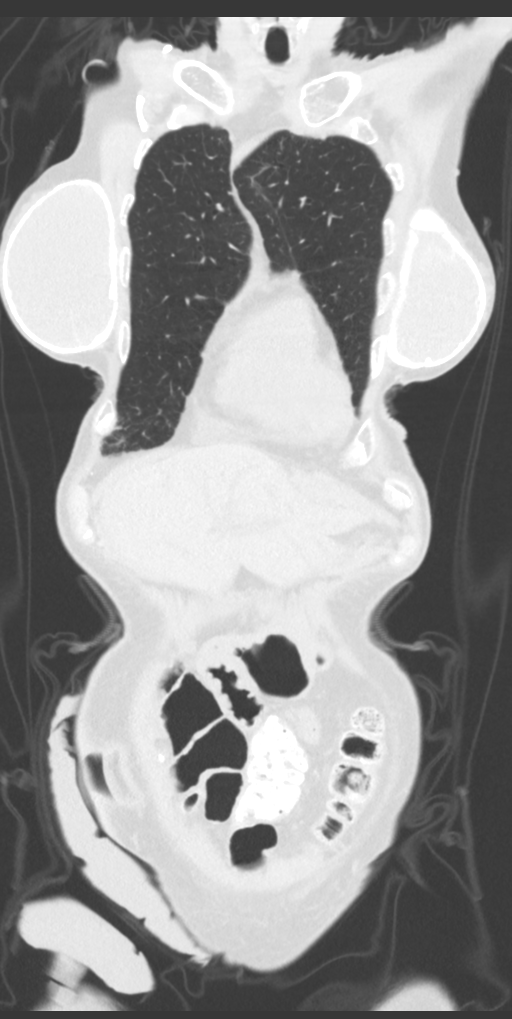
[im 47/117  lung]
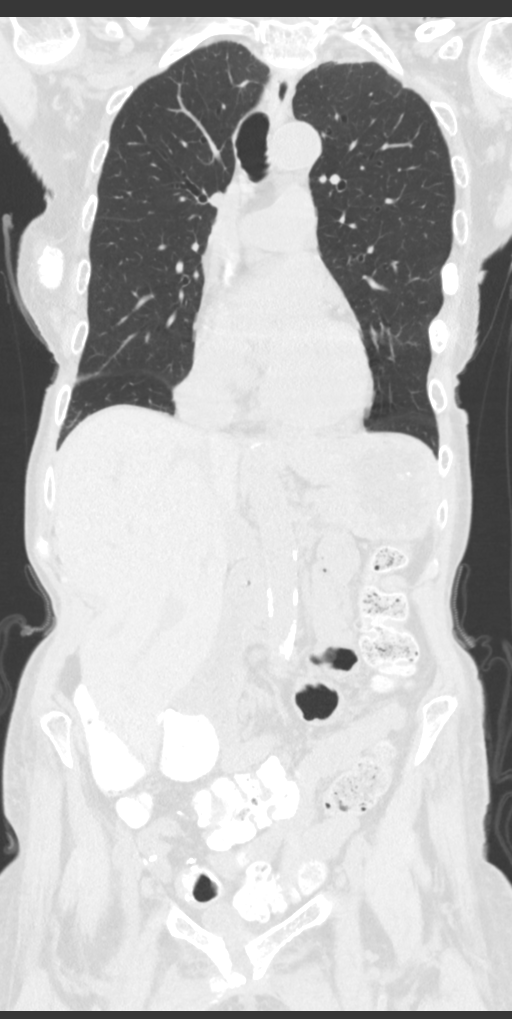
[im 70/117  lung]
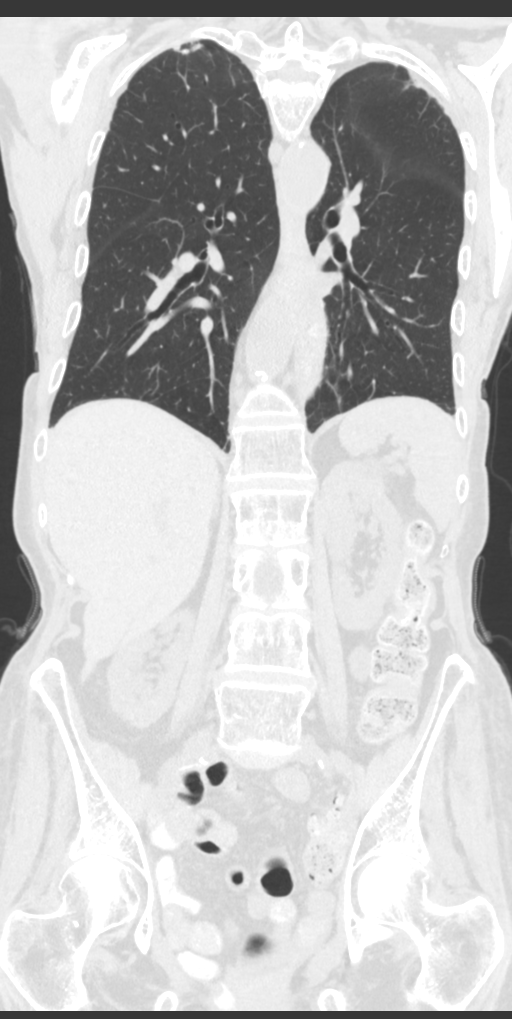

[12 of 36 positions shown; findings below may reference images not displayed]

FINDINGS: CT CHEST FINDINGS

Cardiovascular: Right chest port catheter. Aortic atherosclerosis.
Normal heart size. No pericardial effusion.

Mediastinum/Nodes: No enlarged mediastinal, hilar, or axillary lymph
nodes. Thyroid gland, trachea, and esophagus demonstrate no
significant findings.

Lungs/Pleura: Benign calcified 4 mm nodule of the right pulmonary
apex (series 6, image 15). Adjacent calcified small pleural plaque.
No pleural effusion or pneumothorax.

Musculoskeletal: Osteopenia. Multiple nondisplaced sclerotic
fractures of the bilateral ribs. Exaggerated kyphosis of the
thoracic spine. Calcified bilateral breast implants.

CT ABDOMEN PELVIS FINDINGS

Hepatobiliary: No solid liver abnormality is seen. Stable fluid
attenuation lesions of the right lobe of the liver, consistent with
simple cysts (series 2, image 53, 52 56) no gallstones, gallbladder
wall thickening, or biliary dilatation.

Pancreas: Unremarkable. No pancreatic ductal dilatation or
surrounding inflammatory changes.

Spleen: Normal in size without significant abnormality.

Adrenals/Urinary Tract: Adrenal glands are unremarkable. Kidneys are
normal, without renal calculi, solid lesion, or hydronephrosis.
Status post cystectomy and right lower quadrant ileal conduit
urinary diversion.

Stomach/Bowel: Stomach is within normal limits. Appendix appears
normal. No evidence of bowel wall thickening, distention, or
inflammatory changes. Sigmoid diverticulosis.

Vascular/Lymphatic: Aortic atherosclerosis. No enlarged abdominal or
pelvic lymph nodes.

Reproductive: Status post hysterectomy

Other: No abdominal wall hernia or abnormality. No abdominopelvic
ascites.

Musculoskeletal: There is a new lytic lesion of the right pubis at
the pubic symphysis about previously seen fractures (series 2, image
104). An adjacent rounded soft tissue nodule is decreased in size,
measuring 3.2 x 2.8 cm, previously 3.7 x 3.1 cm (series 2, image
102). There is new sclerosis about an angulated fracture of the S3
sacral segment (series 7, image 67). There is extensive new
sclerosis about bilateral sacral ala fractures (series 8, image 9).
IMPRESSION: 1. Redemonstrated postoperative findings of cystectomy and right
lower quadrant ileal conduit urinary diversion.

2. There is a new lytic lesion of the right pubis at the pubic
symphysis about previously seen fractures (series 2, image 104). An
adjacent rounded soft tissue nodule is decreased in size, measuring
3.2 x 2.8 cm, previously 3.7 x 3.1 cm (series 2, image 102).
Findings remain concerning for metastatic lesion, although improved
compared to prior examination. Contrast administration would be
helpful to assess for enhancing soft tissue.

3.  No other non-contrast CT evidence of metastatic disease.

4. There is new sclerosis about an angulated fracture of the S3
sacral segment (series 7, image 67). There is extensive new
sclerosis about bilateral sacral ala fractures (series 8, image 9).
No discrete metastatic lesions are appreciated.

5.  Aortic Atherosclerosis ([id number]).

## 2020-07-24 IMAGING — US US EXTREM LOW VENOUS*L*
1 series · 13 of 24 positions shown · non-contrast
Comparison: None

CLINICAL DATA: LEFT leg swelling for 2 months, had surgery
07/24/2018



[Series 1: us extrem low venous*left* · 0.07mm/px · 13 of 41 slices shown]
[im 1/41]
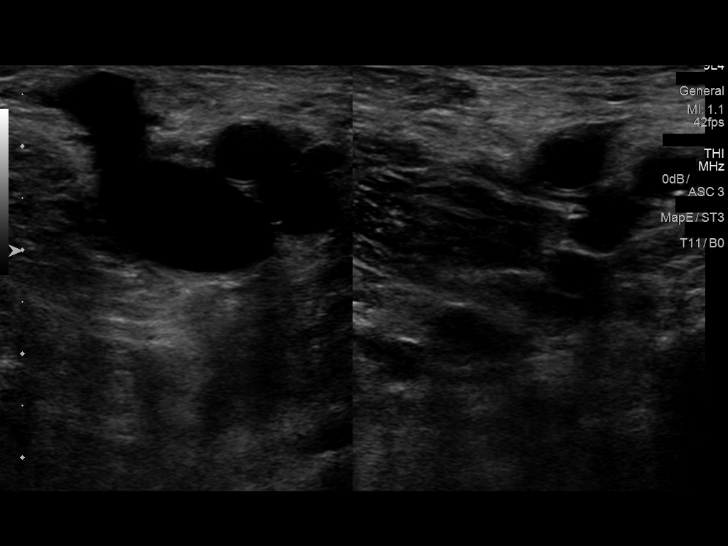
[im 4/41]
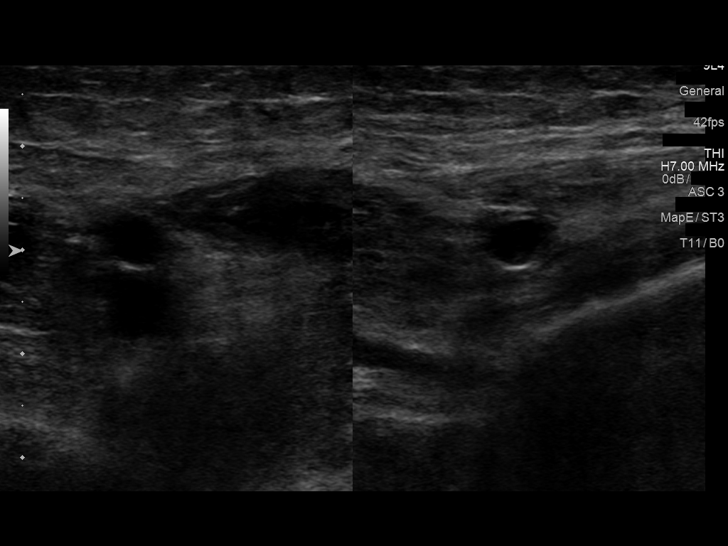
[im 7/41]
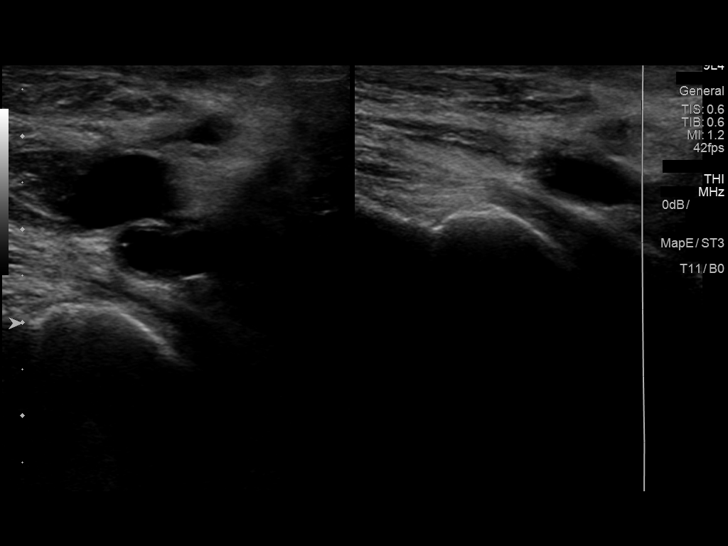
[im 11/41]
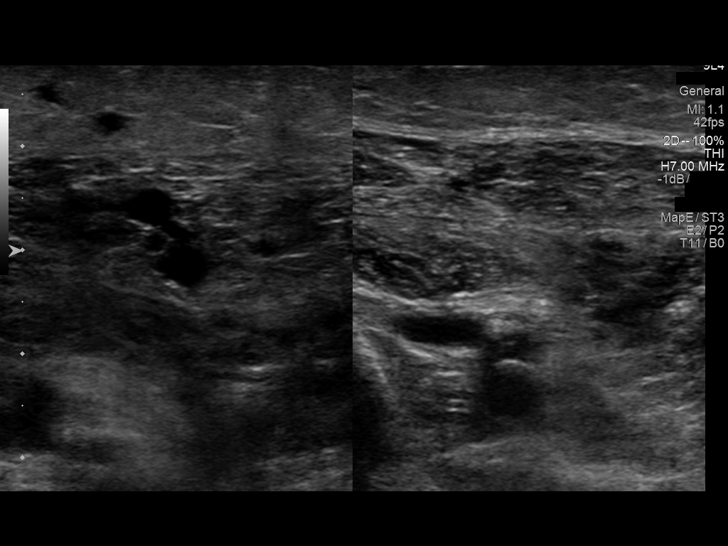
[im 14/41]
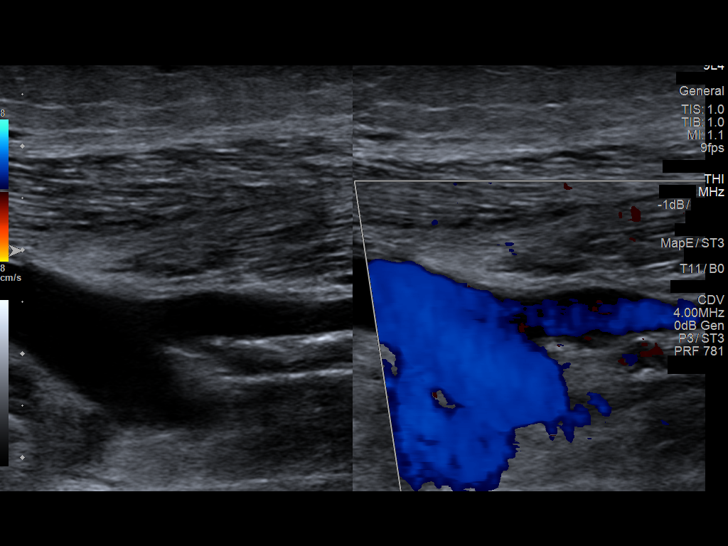
[im 18/41]
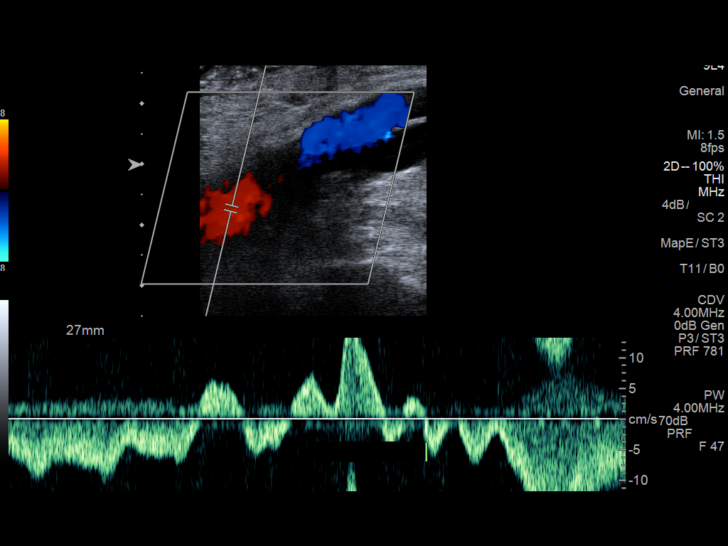
[im 21/41]
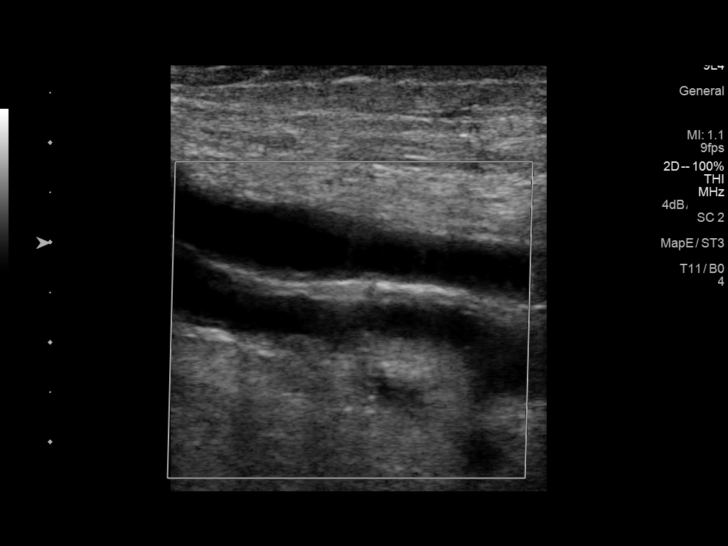
[im 23/41]
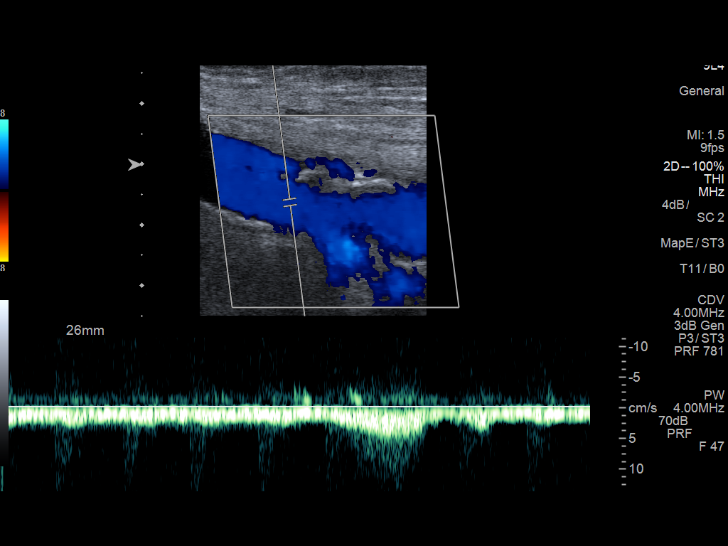
[im 27/41]
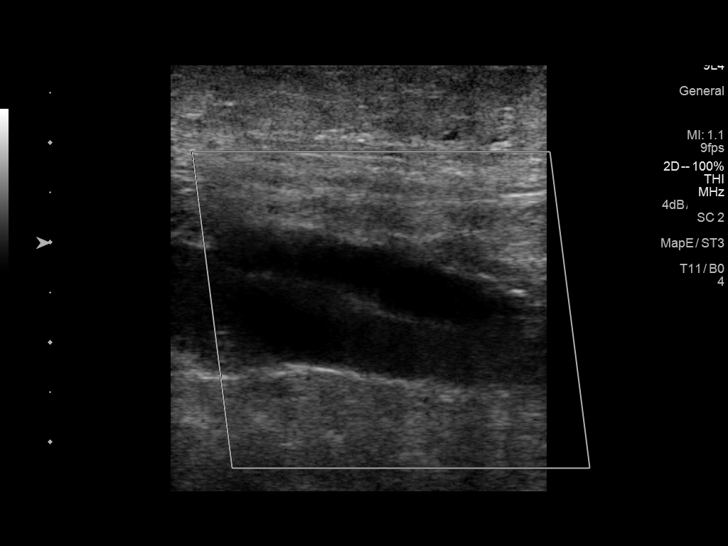
[im 30/41]
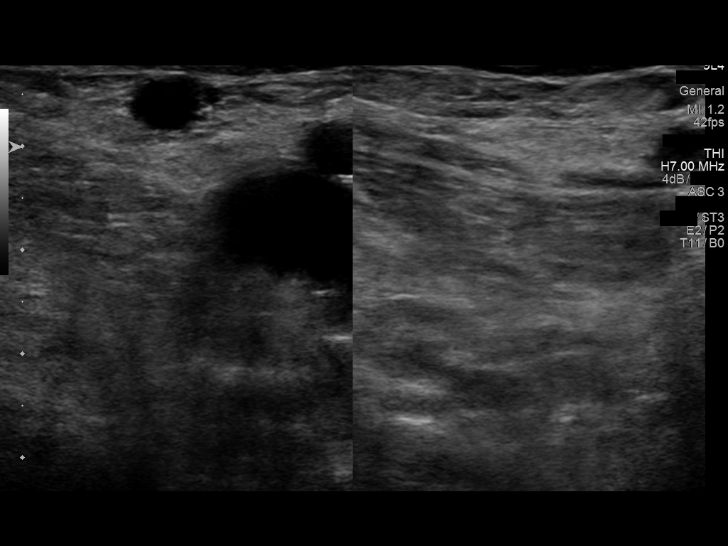
[im 34/41]
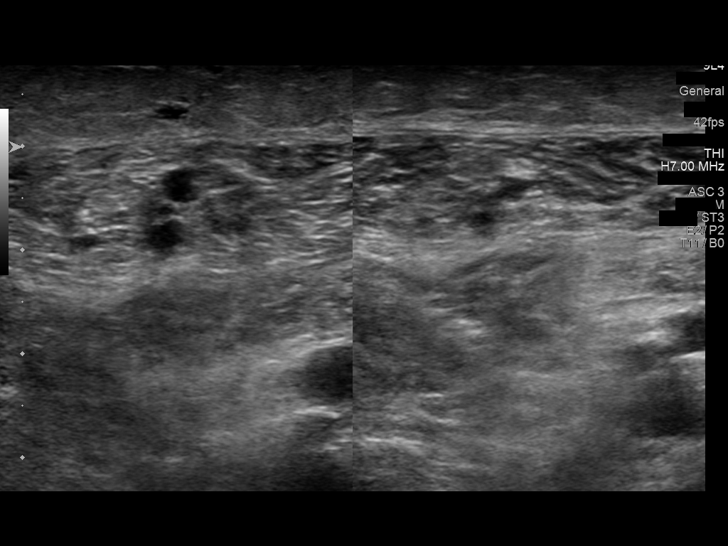
[im 37/41]
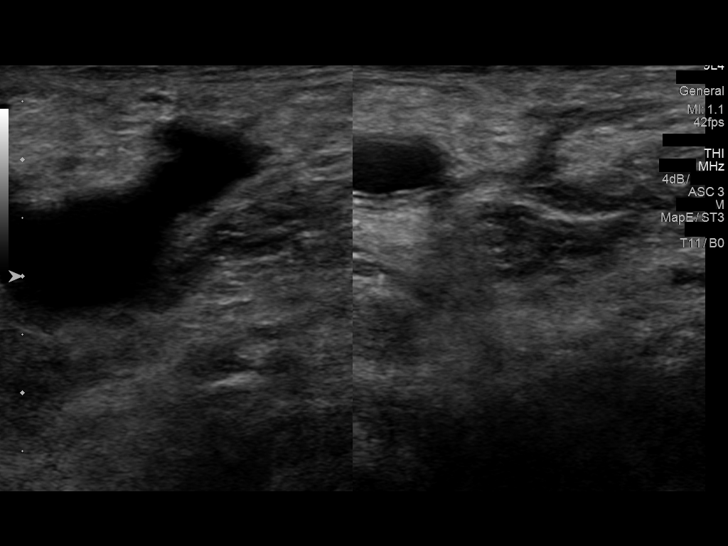
[im 41/41]
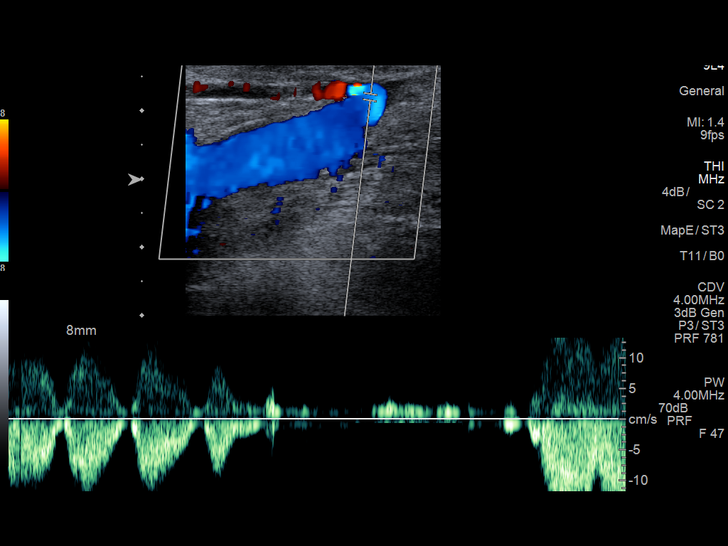

[13 of 24 positions shown; findings below may reference images not displayed]

FINDINGS: Contralateral Common Femoral Vein: Respiratory phasicity is normal
and symmetric with the symptomatic side. No evidence of thrombus.
Normal compressibility.

Common Femoral Vein: No evidence of thrombus. Normal
compressibility, respiratory phasicity and response to augmentation.

Saphenofemoral Junction: No evidence of thrombus. Normal
compressibility and flow on color Doppler imaging.

Profunda Femoral Vein: No evidence of thrombus. Normal
compressibility and flow on color Doppler imaging.

Femoral Vein: No evidence of thrombus. Normal compressibility,
respiratory phasicity and response to augmentation.

Popliteal Vein: No evidence of thrombus. Normal compressibility,
respiratory phasicity and response to augmentation.

Calf Veins: No evidence of thrombus. Normal compressibility and flow
on color Doppler imaging.

Superficial Great Saphenous Vein: No evidence of thrombus. Normal
compressibility.

Venous Reflux:  None.

Other Findings:  Mild subcutaneous edema at the LEFT ankle.
IMPRESSION: No evidence of deep venous thrombosis in the LEFT lower extremity.

## 2023-09-05 NOTE — Telephone Encounter (Signed)
Telephone call
